# Patient Record
Sex: Male | Born: 1965 | Race: Black or African American | Hispanic: No | Marital: Married | Smoking: Former smoker
Health system: Southern US, Community
[De-identification: ages and names within clinical notes are randomized; demographics above are authoritative.]

## PROBLEM LIST (undated history)

## (undated) DIAGNOSIS — Z9109 Other allergy status, other than to drugs and biological substances: Secondary | ICD-10-CM

## (undated) DIAGNOSIS — F419 Anxiety disorder, unspecified: Secondary | ICD-10-CM

## (undated) DIAGNOSIS — K219 Gastro-esophageal reflux disease without esophagitis: Secondary | ICD-10-CM

## (undated) DIAGNOSIS — A048 Other specified bacterial intestinal infections: Secondary | ICD-10-CM

## (undated) DIAGNOSIS — K5790 Diverticulosis of intestine, part unspecified, without perforation or abscess without bleeding: Secondary | ICD-10-CM

## (undated) HISTORY — DX: Other specified bacterial intestinal infections: A04.8

## (undated) HISTORY — DX: Diverticulosis of intestine, part unspecified, without perforation or abscess without bleeding: K57.90

## (undated) HISTORY — DX: Gastro-esophageal reflux disease without esophagitis: K21.9

## (undated) HISTORY — DX: Anxiety disorder, unspecified: F41.9

## (undated) HISTORY — PX: KNEE ARTHROSCOPY W/ MENISCAL REPAIR: SHX1877

---

## 1997-12-12 ENCOUNTER — Emergency Department (HOSPITAL_COMMUNITY): Admission: EM | Admit: 1997-12-12 | Discharge: 1997-12-12 | Payer: Self-pay | Admitting: Emergency Medicine

## 1997-12-14 ENCOUNTER — Emergency Department (HOSPITAL_COMMUNITY): Admission: EM | Admit: 1997-12-14 | Discharge: 1997-12-14 | Payer: Self-pay | Admitting: Emergency Medicine

## 1997-12-27 ENCOUNTER — Ambulatory Visit (HOSPITAL_COMMUNITY): Admission: RE | Admit: 1997-12-27 | Discharge: 1997-12-27 | Payer: Self-pay | Admitting: Family Medicine

## 2002-02-11 ENCOUNTER — Emergency Department (HOSPITAL_COMMUNITY): Admission: EM | Admit: 2002-02-11 | Discharge: 2002-02-11 | Payer: Self-pay | Admitting: *Deleted

## 2002-02-11 ENCOUNTER — Encounter: Payer: Self-pay | Admitting: *Deleted

## 2002-06-25 ENCOUNTER — Encounter: Payer: Self-pay | Admitting: Emergency Medicine

## 2002-06-25 ENCOUNTER — Emergency Department (HOSPITAL_COMMUNITY): Admission: EM | Admit: 2002-06-25 | Discharge: 2002-06-26 | Payer: Self-pay | Admitting: Emergency Medicine

## 2002-06-26 ENCOUNTER — Emergency Department (HOSPITAL_COMMUNITY): Admission: EM | Admit: 2002-06-26 | Discharge: 2002-06-26 | Payer: Self-pay | Admitting: Emergency Medicine

## 2002-07-27 ENCOUNTER — Encounter: Payer: Self-pay | Admitting: Family Medicine

## 2002-07-27 ENCOUNTER — Encounter: Admission: RE | Admit: 2002-07-27 | Discharge: 2002-07-27 | Payer: Self-pay | Admitting: Family Medicine

## 2002-08-05 ENCOUNTER — Ambulatory Visit (HOSPITAL_COMMUNITY): Admission: RE | Admit: 2002-08-05 | Discharge: 2002-08-05 | Payer: Self-pay | Admitting: Otolaryngology

## 2008-06-03 ENCOUNTER — Encounter: Admission: RE | Admit: 2008-06-03 | Discharge: 2008-06-03 | Payer: Self-pay | Admitting: Family Medicine

## 2009-02-13 ENCOUNTER — Emergency Department (HOSPITAL_COMMUNITY): Admission: EM | Admit: 2009-02-13 | Discharge: 2009-02-13 | Payer: Self-pay | Admitting: Emergency Medicine

## 2009-03-22 ENCOUNTER — Emergency Department (HOSPITAL_COMMUNITY): Admission: EM | Admit: 2009-03-22 | Discharge: 2009-03-23 | Payer: Self-pay | Admitting: Emergency Medicine

## 2009-03-23 ENCOUNTER — Encounter: Admission: RE | Admit: 2009-03-23 | Discharge: 2009-03-23 | Payer: Self-pay | Admitting: Family Medicine

## 2009-03-23 ENCOUNTER — Ambulatory Visit (HOSPITAL_COMMUNITY): Admission: RE | Admit: 2009-03-23 | Discharge: 2009-03-23 | Payer: Self-pay | Admitting: Emergency Medicine

## 2009-04-01 ENCOUNTER — Emergency Department (HOSPITAL_COMMUNITY): Admission: EM | Admit: 2009-04-01 | Discharge: 2009-04-02 | Payer: Self-pay | Admitting: Emergency Medicine

## 2009-04-30 ENCOUNTER — Encounter: Admission: RE | Admit: 2009-04-30 | Discharge: 2009-04-30 | Payer: Self-pay | Admitting: Neurology

## 2009-05-15 ENCOUNTER — Emergency Department (HOSPITAL_COMMUNITY): Admission: EM | Admit: 2009-05-15 | Discharge: 2009-05-15 | Payer: Self-pay | Admitting: Emergency Medicine

## 2009-09-02 ENCOUNTER — Encounter: Admission: RE | Admit: 2009-09-02 | Discharge: 2009-09-02 | Payer: Self-pay | Admitting: Family Medicine

## 2010-04-09 ENCOUNTER — Encounter: Payer: Self-pay | Admitting: Neurology

## 2010-04-09 ENCOUNTER — Encounter: Payer: Self-pay | Admitting: Emergency Medicine

## 2010-06-04 LAB — COMPREHENSIVE METABOLIC PANEL
AST: 22 U/L (ref 0–37)
Albumin: 3.8 g/dL (ref 3.5–5.2)
Albumin: 4.6 g/dL (ref 3.5–5.2)
Alkaline Phosphatase: 45 U/L (ref 39–117)
BUN: 9 mg/dL (ref 6–23)
BUN: 12 mg/dL (ref 6–23)
Calcium: 9.1 mg/dL (ref 8.4–10.5)
Calcium: 9.3 mg/dL (ref 8.4–10.5)
Chloride: 105 mEq/L (ref 96–112)
Creatinine, Ser: 1.04 mg/dL (ref 0.4–1.5)
Creatinine, Ser: 1.14 mg/dL (ref 0.4–1.5)
GFR calc Af Amer: >60 mL/min (ref >60)
GFR calc Af Amer: >60 mL/min (ref >60)
Glucose, Bld: 135 mg/dL — ABNORMAL HIGH (ref 70–99)
Potassium: 3.7 mEq/L (ref 3.5–5.1)
Total Bilirubin: 0.7 mg/dL (ref 0.3–1.2)
Total Bilirubin: 0.6 mg/dL (ref 0.3–1.2)
Total Protein: 6.5 g/dL (ref 6.0–8.3)
Total Protein: 7.5 g/dL (ref 6.0–8.3)

## 2010-06-04 LAB — CBC
HCT: 41.3 % (ref 39.0–52.0)
HCT: 44.2 % (ref 39.0–52.0)
Hemoglobin: 14.9 g/dL (ref 13.0–17.0)
MCHC: 34.5 g/dL (ref 30.0–36.0)
MCV: 95.8 fL (ref 78.0–100.0)
Platelets: 252 10*3/uL (ref 150–400)
RBC: 4.37 MIL/uL (ref 4.22–5.81)
WBC: 5.1 10*3/uL (ref 4.0–10.5)

## 2010-06-04 LAB — POCT CARDIAC MARKERS
CKMB, poc: 1.4 ng/mL (ref 1.0–8.0)
Troponin i, poc: <0.05 ng/mL (ref 0.00–0.09)

## 2010-06-04 LAB — CK: Total CK: 565 U/L — ABNORMAL HIGH (ref 7–232)

## 2010-06-04 LAB — DIFFERENTIAL
Basophils Relative: 0 % (ref 0–1)
Eosinophils Absolute: 0.1 10*3/uL (ref 0.0–0.7)
Eosinophils Absolute: 0.1 10*3/uL (ref 0.0–0.7)
Eosinophils Relative: 2 % (ref 0–5)
Lymphocytes Relative: 32 % (ref 12–46)
Lymphs Abs: 1.6 10*3/uL (ref 0.7–4.0)
Monocytes Absolute: 0.5 10*3/uL (ref 0.1–1.0)
Monocytes Relative: 6 % (ref 3–12)
Neutro Abs: 3 10*3/uL (ref 1.7–7.7)
Neutrophils Relative %: 49 % (ref 43–77)

## 2010-06-04 LAB — LIPASE, BLOOD: Lipase: 34 U/L (ref 11–59)

## 2010-06-07 LAB — DIFFERENTIAL
Lymphocytes Relative: 20 % (ref 12–46)
Neutro Abs: 3.2 10*3/uL (ref 1.7–7.7)
Neutrophils Relative %: 72 % (ref 43–77)

## 2010-06-07 LAB — CBC
HCT: 42.8 % (ref 39.0–52.0)
Hemoglobin: 14.4 g/dL (ref 13.0–17.0)
MCHC: 33.7 g/dL (ref 30.0–36.0)
MCV: 95.7 fL (ref 78.0–100.0)
Platelets: 217 10*3/uL (ref 150–400)
RDW: 12.5 % (ref 11.5–15.5)
WBC: 4.4 10*3/uL (ref 4.0–10.5)

## 2010-06-07 LAB — BASIC METABOLIC PANEL
BUN: 10 mg/dL (ref 6–23)
Creatinine, Ser: 0.98 mg/dL (ref 0.4–1.5)
GFR calc Af Amer: >60 mL/min (ref >60)
Sodium: 138 mEq/L (ref 135–145)

## 2010-06-21 LAB — BASIC METABOLIC PANEL
BUN: 9 mg/dL (ref 6–23)
Chloride: 100 mEq/L (ref 96–112)
GFR calc Af Amer: >60 mL/min (ref >60)
GFR calc non Af Amer: >60 mL/min (ref >60)
Glucose, Bld: 112 mg/dL — ABNORMAL HIGH (ref 70–99)
Potassium: 3.3 mEq/L — ABNORMAL LOW (ref 3.5–5.1)
Sodium: 133 mEq/L — ABNORMAL LOW (ref 135–145)

## 2010-06-21 LAB — CBC
HCT: 44.2 % (ref 39.0–52.0)
MCHC: 34.8 g/dL (ref 30.0–36.0)
MCV: 95.3 fL (ref 78.0–100.0)
Platelets: 228 10*3/uL (ref 150–400)
RBC: 4.64 MIL/uL (ref 4.22–5.81)

## 2010-06-21 LAB — DIFFERENTIAL
Eosinophils Absolute: 0.1 10*3/uL (ref 0.0–0.7)
Lymphocytes Relative: 40 % (ref 12–46)
Lymphs Abs: 2.1 10*3/uL (ref 0.7–4.0)
Monocytes Absolute: 0.6 10*3/uL (ref 0.1–1.0)
Monocytes Relative: 12 % (ref 3–12)
Neutro Abs: 2.5 10*3/uL (ref 1.7–7.7)

## 2010-06-21 LAB — POCT CARDIAC MARKERS: Myoglobin, poc: 106 ng/mL (ref 12–200)

## 2010-10-30 IMAGING — CT CT HEAD W/O CM
1 series · 16 of 30 positions shown, 20 images · non-contrast
Comparison: None available

CLINICAL DATA: Left facial numbness, chest pressure

CT HEAD WITHOUT CONTRAST
TECHNIQUE: Contiguous axial images were obtained from the base of
the skull through the vertex without contrast.

[Series 2: headseq 4.8 h37s · axial · 0.43mm/px · z∈[+1195,+1355]mm · 16 of 36 slices shown, 20 images]
[im 2/36  brain]
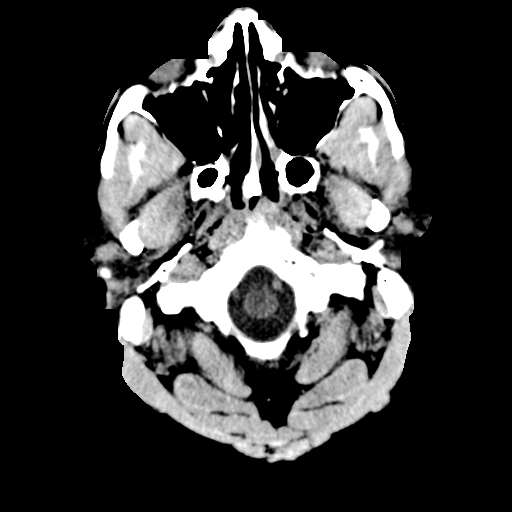
[im 2/36  bone]
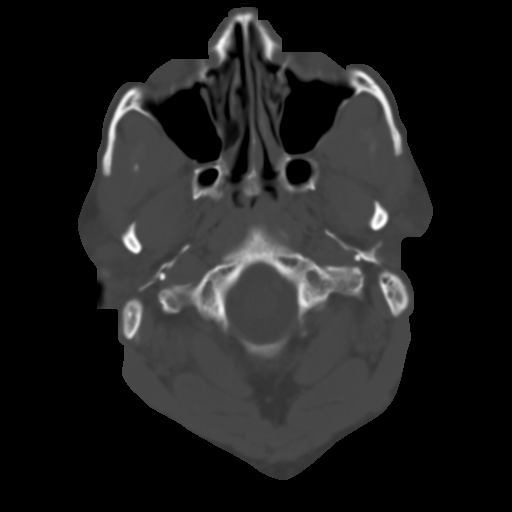
[im 4/36  brain]
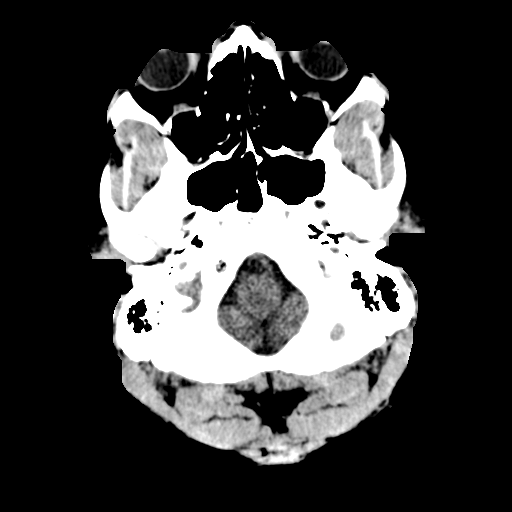
[im 7/36  brain]
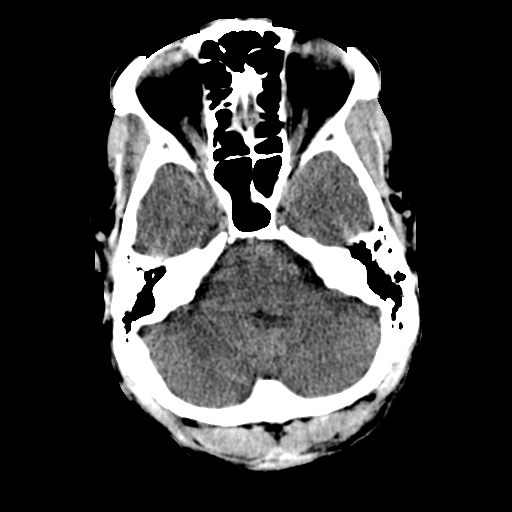
[im 9/36  brain]
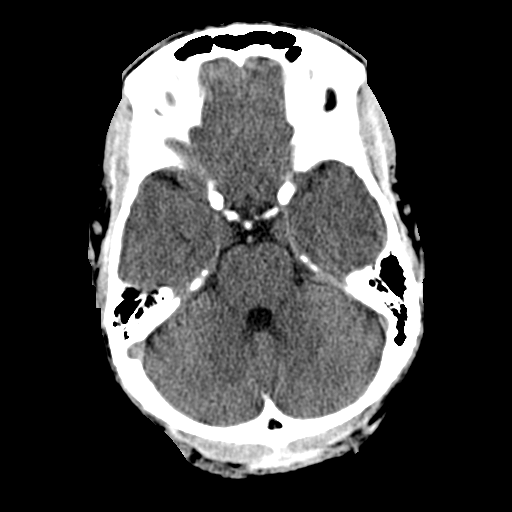
[im 10/36  brain]
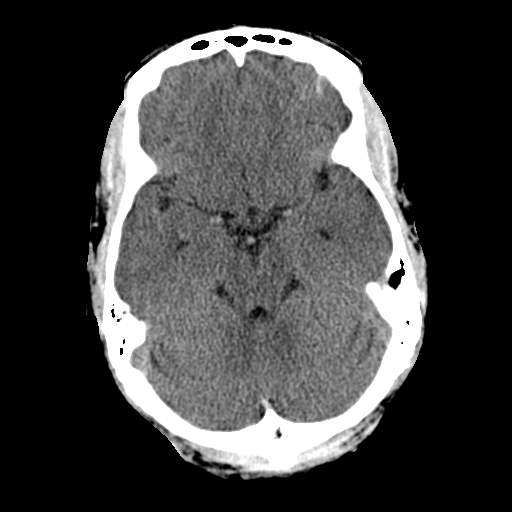
[im 10/36  bone]
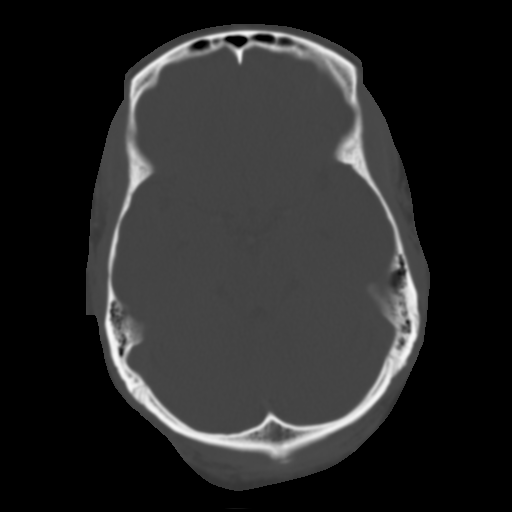
[im 13/36  brain]
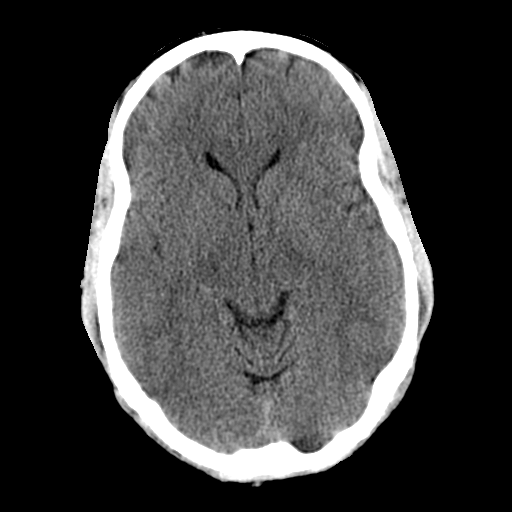
[im 15/36  brain]
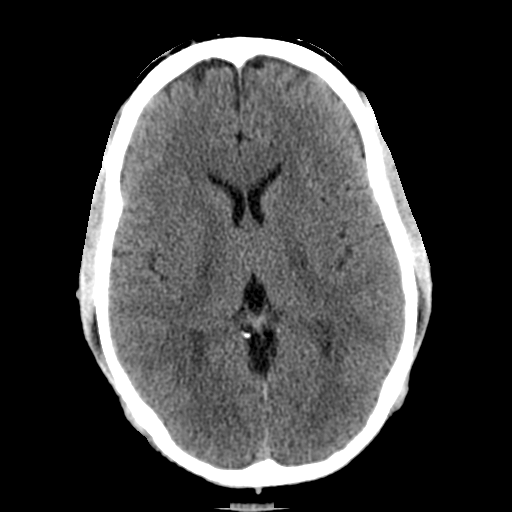
[im 17/36  brain]
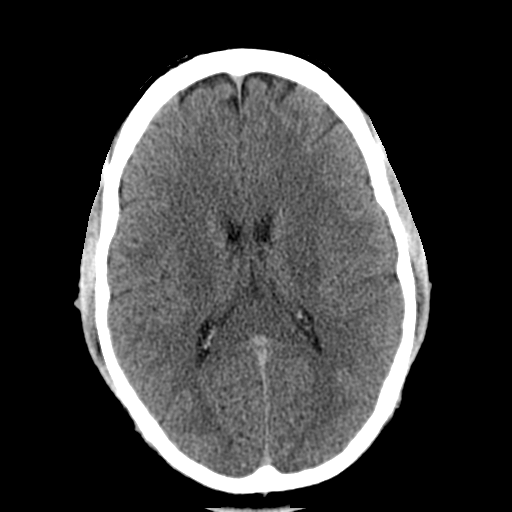
[im 19/36  brain]
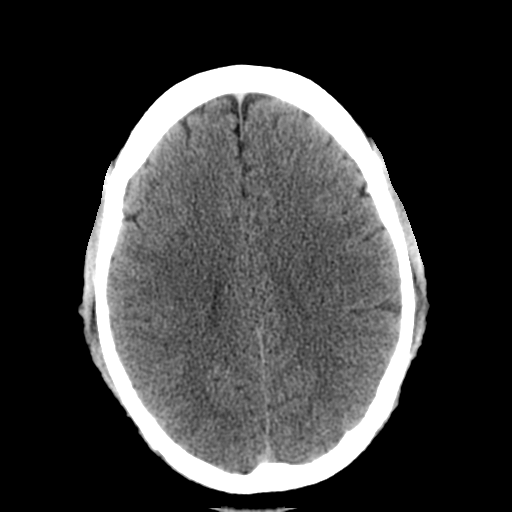
[im 19/36  bone]
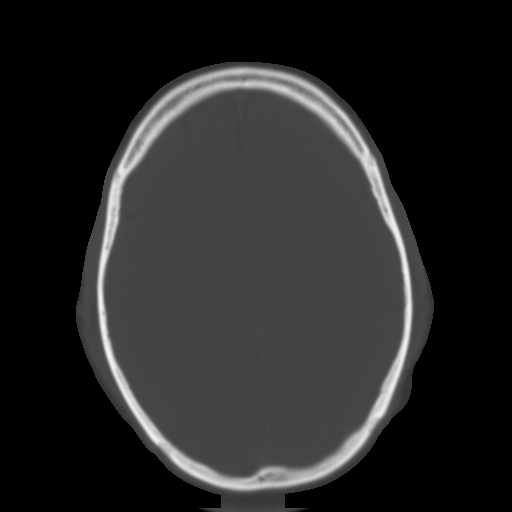
[im 21/36  brain]
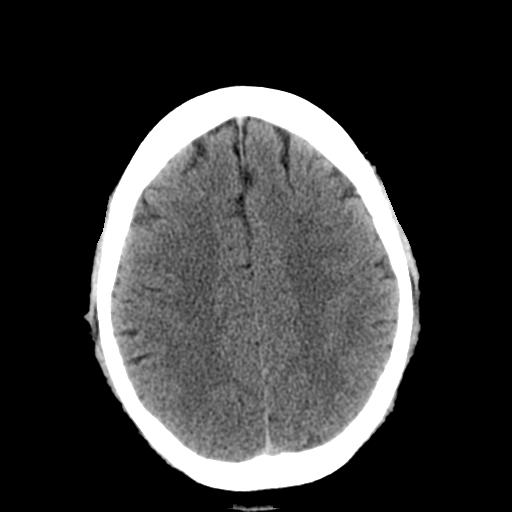
[im 23/36  brain]
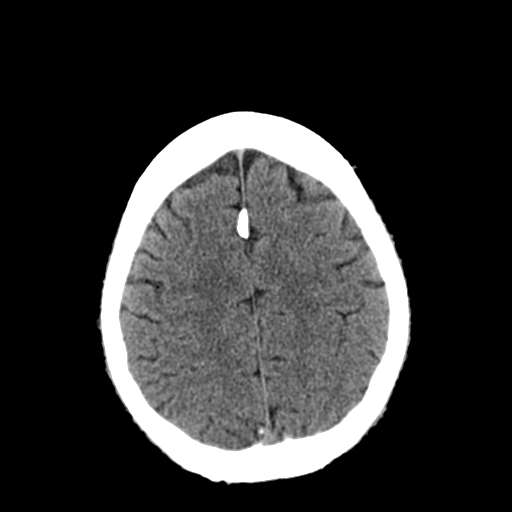
[im 26/36  brain]
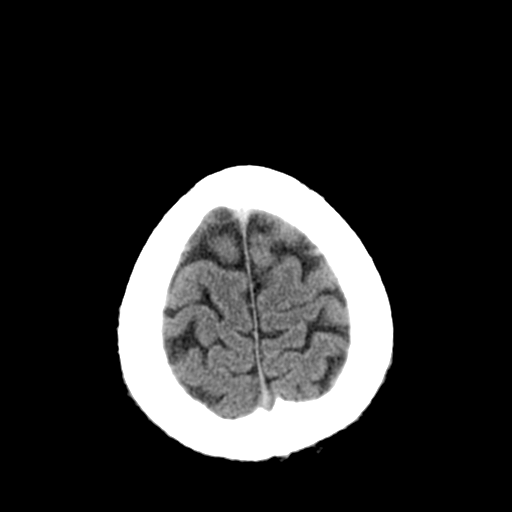
[im 27/36  brain]
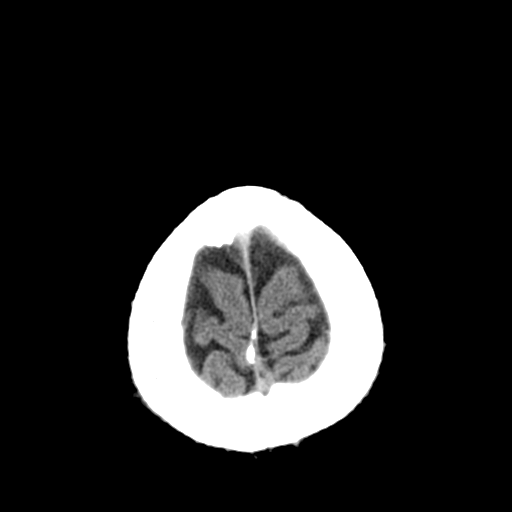
[im 27/36  bone]
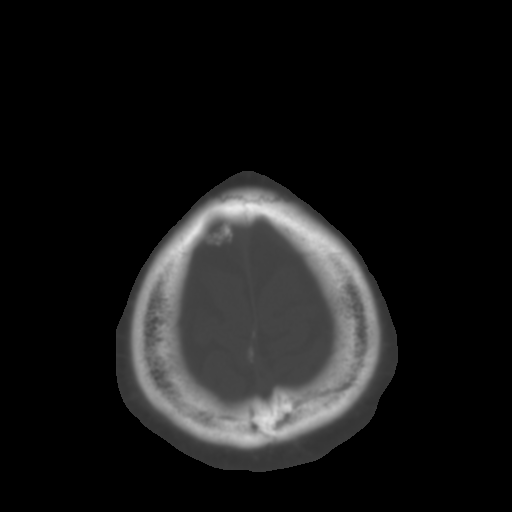
[im 29/36  brain]
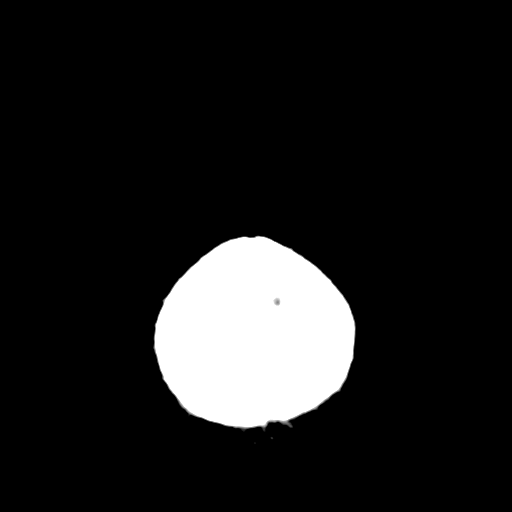
[im 32/36  brain]
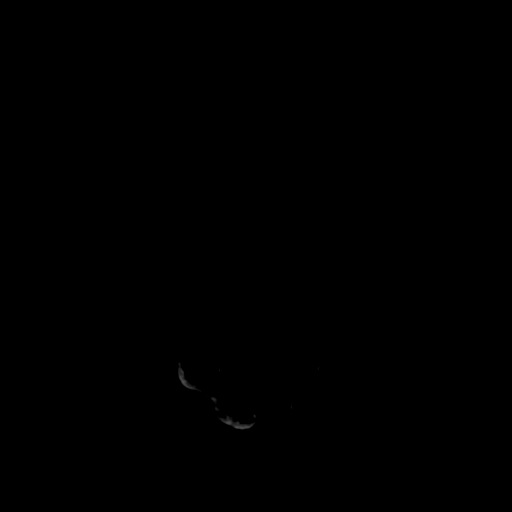
[im 34/36  brain]
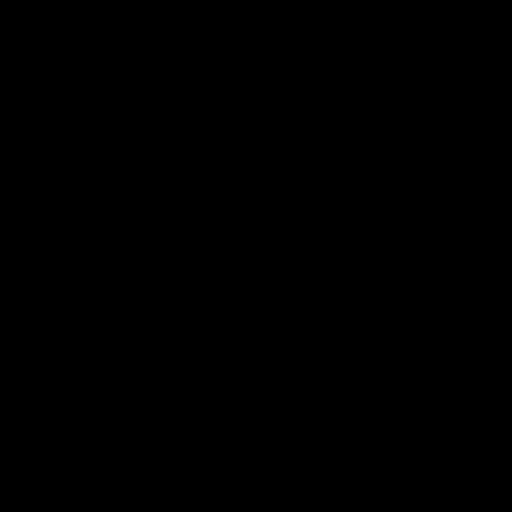

[16 of 30 positions shown; findings below may reference images not displayed]

FINDINGS: Normal ventricular morphology.
No midline shift or mass effect.
Normal appearance of brain parenchyma.
No intracranial hemorrhage, mass lesion, or acute infarction.
Visualized paranasal sinuses and mastoid air cells clear.
Bones unremarkable.
IMPRESSION: No acute intracranial abnormalities.

## 2010-10-30 IMAGING — CR DG CHEST 2V
2 series · 2 of 2 positions shown · non-contrast
Comparison: None

CLINICAL DATA: Left-sided chest and shoulder pressure; facial
numbness.

CHEST - 2 VIEW

[view not recorded (1 of 2)]
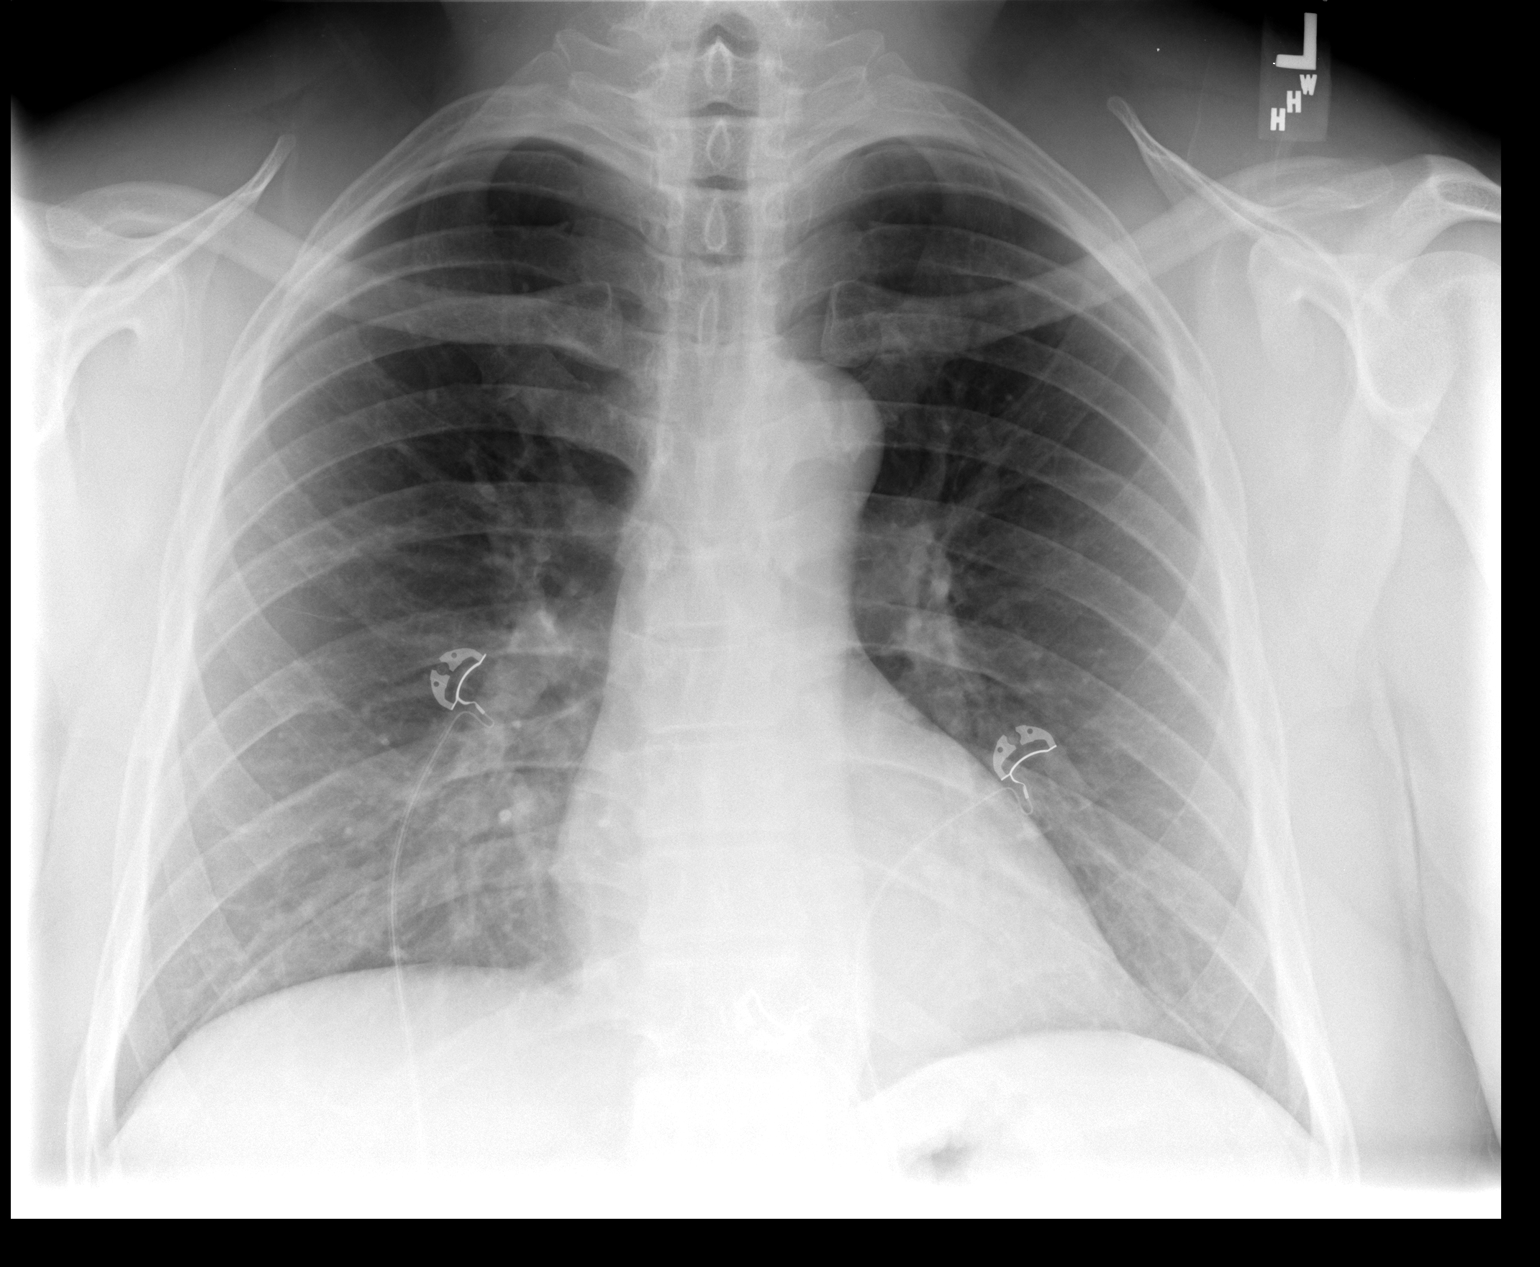

[view not recorded (2 of 2)]
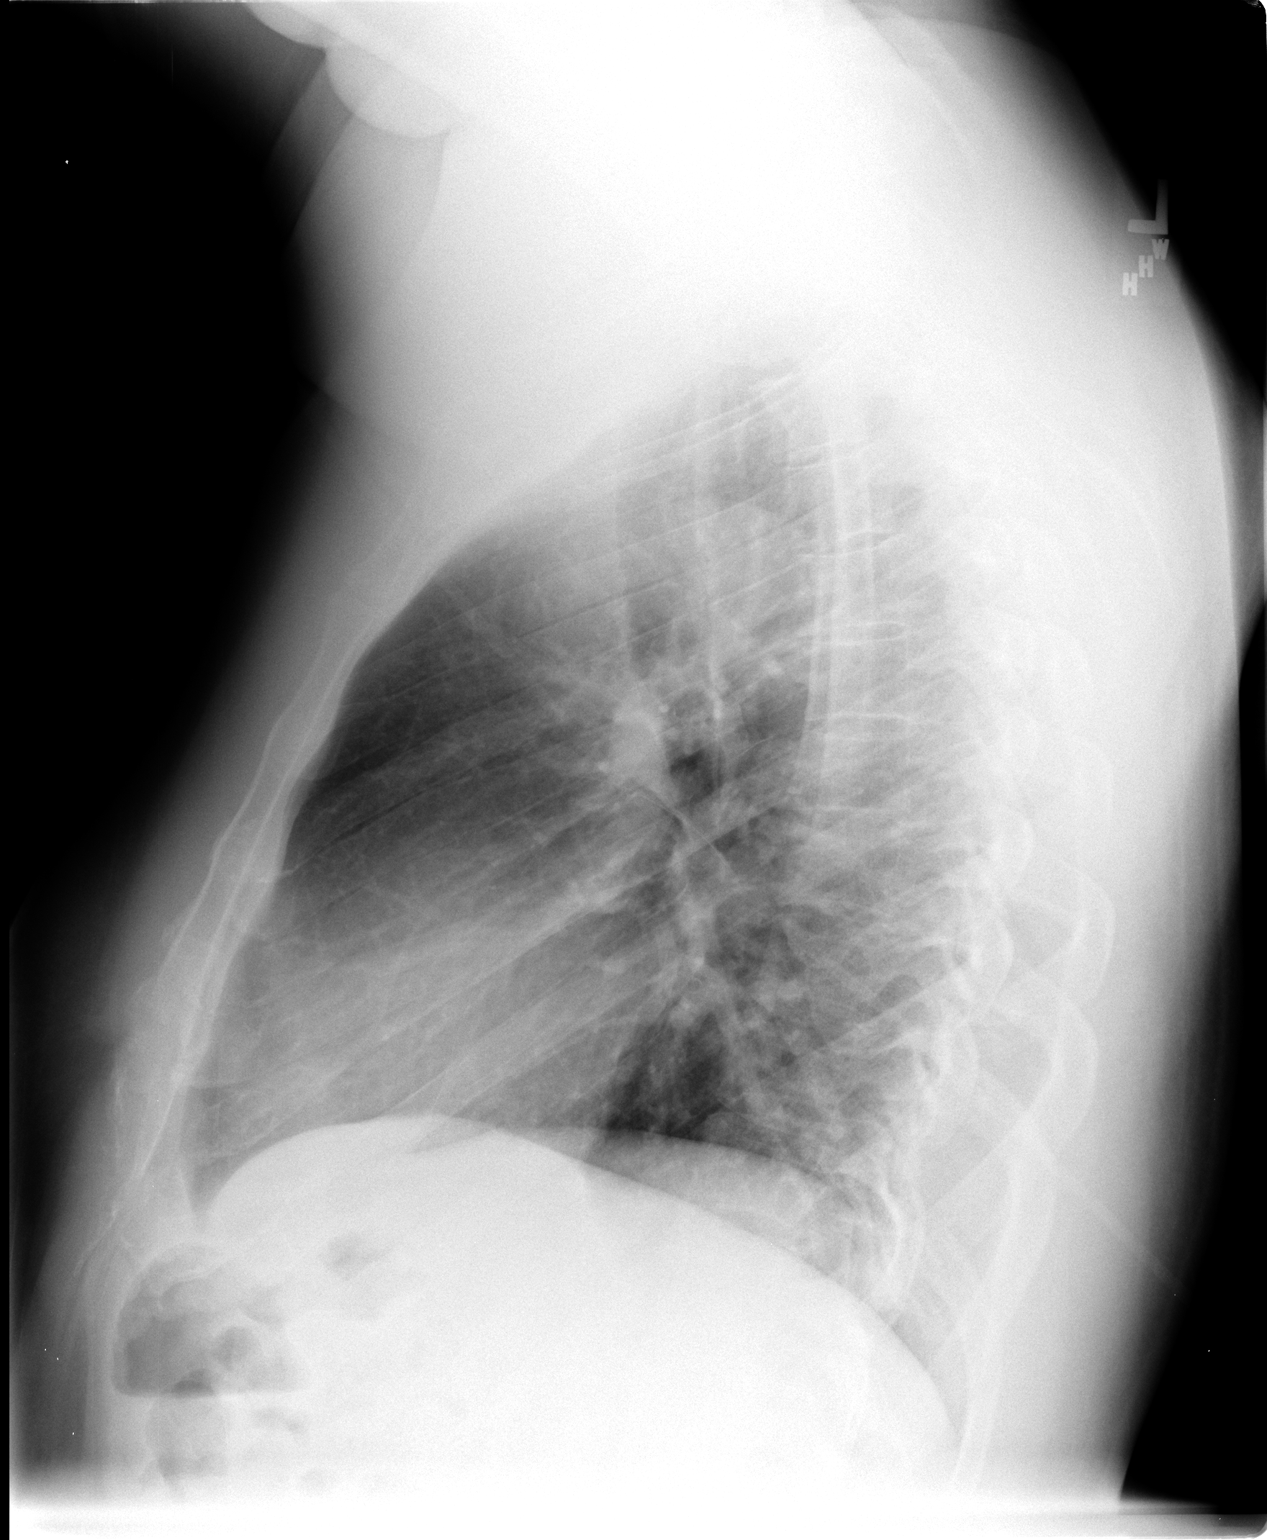

[2 of 2 positions shown; findings below may reference images not displayed]

FINDINGS: The lungs are well-aerated and clear.  There is no
evidence of focal opacification, pleural effusion or pneumothorax.

The heart is normal in size; the mediastinal contour is within
normal limits.  No acute osseous abnormalities are seen.
IMPRESSION: No acute cardiopulmonary process seen.

## 2011-09-17 ENCOUNTER — Emergency Department (HOSPITAL_COMMUNITY): Payer: BC Managed Care – PPO

## 2011-09-17 ENCOUNTER — Emergency Department (HOSPITAL_COMMUNITY)
Admission: EM | Admit: 2011-09-17 | Discharge: 2011-09-17 | Disposition: A | Discharge status: Home / Self Care | Payer: BC Managed Care – PPO | Attending: Emergency Medicine | Admitting: Emergency Medicine

## 2011-09-17 ENCOUNTER — Encounter (HOSPITAL_COMMUNITY): Payer: Self-pay | Admitting: Emergency Medicine

## 2011-09-17 DIAGNOSIS — R61 Generalized hyperhidrosis: Secondary | ICD-10-CM | POA: Insufficient documentation

## 2011-09-17 DIAGNOSIS — R0789 Other chest pain: Secondary | ICD-10-CM | POA: Insufficient documentation

## 2011-09-17 LAB — CBC WITH DIFFERENTIAL/PLATELET
Basophils Relative: 0 % (ref 0–1)
Eosinophils Absolute: 0.1 10*3/uL (ref 0.0–0.7)
Eosinophils Relative: 2 % (ref 0–5)
HCT: 41.2 % (ref 39.0–52.0)
Hemoglobin: 14.5 g/dL (ref 13.0–17.0)
Lymphs Abs: 1.9 10*3/uL (ref 0.7–4.0)
MCH: 31.9 pg (ref 26.0–34.0)
MCHC: 35.2 g/dL (ref 30.0–36.0)
MCV: 90.5 fL (ref 78.0–100.0)
Monocytes Absolute: 0.4 10*3/uL (ref 0.1–1.0)
Monocytes Relative: 9 % (ref 3–12)
Neutrophils Relative %: 45 % (ref 43–77)
RBC: 4.55 MIL/uL (ref 4.22–5.81)

## 2011-09-17 LAB — POCT I-STAT TROPONIN I: Troponin i, poc: 0 ng/mL (ref 0.00–0.08)

## 2011-09-17 LAB — COMPREHENSIVE METABOLIC PANEL
Alkaline Phosphatase: 51 U/L (ref 39–117)
BUN: 12 mg/dL (ref 6–23)
Creatinine, Ser: 1.13 mg/dL (ref 0.50–1.35)
GFR calc Af Amer: 89 mL/min — ABNORMAL LOW (ref >90)
Glucose, Bld: 123 mg/dL — ABNORMAL HIGH (ref 70–99)
Potassium: 3.6 mEq/L (ref 3.5–5.1)
Total Bilirubin: 0.4 mg/dL (ref 0.3–1.2)
Total Protein: 6.8 g/dL (ref 6.0–8.3)

## 2011-09-17 MED ORDER — NITROGLYCERIN 2 % TD OINT
1.0000 [in_us] | TOPICAL_OINTMENT | Freq: Once | TRANSDERMAL | Status: AC
  Administered 2011-09-17: 1 [in_us] via TOPICAL
  Filled 2011-09-17: qty 1

## 2011-09-17 NOTE — Discharge Instructions (Signed)
Chest Pain (Nonspecific) It is often hard to give a specific diagnosis for the cause of chest pain. There is always a chance that your pain could be related to something serious, such as a heart attack or a blood clot in the lungs. You need to follow up with your caregiver for further evaluation. CAUSES   Heartburn.   Pneumonia or bronchitis.   Anxiety or stress.   Inflammation around your heart (pericarditis) or lung (pleuritis or pleurisy).   A blood clot in the lung.   A collapsed lung (pneumothorax). It can develop suddenly on its own (spontaneous pneumothorax) or from injury (trauma) to the chest.   Shingles infection (herpes zoster virus).  The chest wall is composed of bones, muscles, and cartilage. Any of these can be the source of the pain.  The bones can be bruised by injury.   The muscles or cartilage can be strained by coughing or overwork.   The cartilage can be affected by inflammation and become sore (costochondritis).  DIAGNOSIS  Lab tests or other studies, such as X-rays, electrocardiography, stress testing, or cardiac imaging, may be needed to find the cause of your pain.  TREATMENT   Treatment depends on what may be causing your chest pain. Treatment may include:   Acid blockers for heartburn.   Anti-inflammatory medicine.   Pain medicine for inflammatory conditions.   Antibiotics if an infection is present.   You may be advised to change lifestyle habits. This includes stopping smoking and avoiding alcohol, caffeine, and chocolate.   You may be advised to keep your head raised (elevated) when sleeping. This reduces the chance of acid going backward from your stomach into your esophagus.   Most of the time, nonspecific chest pain will improve within 2 to 3 days with rest and mild pain medicine.  HOME CARE INSTRUCTIONS   If antibiotics were prescribed, take your antibiotics as directed. Finish them even if you start to feel better.   For the next few  days, avoid physical activities that bring on chest pain. Continue physical activities as directed.   Do not smoke.   Avoid drinking alcohol.   Only take over-the-counter or prescription medicine for pain, discomfort, or fever as directed by your caregiver.   Follow your caregiver's suggestions for further testing if your chest pain does not go away.   Keep any follow-up appointments you made. If you do not go to an appointment, you could develop lasting (chronic) problems with pain. If there is any problem keeping an appointment, you must call to reschedule.  SEEK MEDICAL CARE IF:   You think you are having problems from the medicine you are taking. Read your medicine instructions carefully.   Your chest pain does not go away, even after treatment.   You develop a rash with blisters on your chest.  SEEK IMMEDIATE MEDICAL CARE IF:   You have increased chest pain or pain that spreads to your arm, neck, jaw, back, or abdomen.   You develop shortness of breath, an increasing cough, or you are coughing up blood.   You have severe back or abdominal pain, feel nauseous, or vomit.   You develop severe weakness, fainting, or chills.   You have a fever.  THIS IS AN EMERGENCY. Do not wait to see if the pain will go away. Get medical help at once. Call your local emergency services (911 in [place] Do not drive yourself to the hospital. MAKE SURE YOU:   Understand these instructions.     Will watch your condition.   Will get help right away if you are not doing well or get worse.  Document Released: 12/13/2004 Document Revised: 02/22/2011 Document Reviewed: 10/09/2007 [employer] Patient Information 2012 [employer]. 

## 2011-09-17 NOTE — ED Provider Notes (Signed)
Medical screening examination/treatment/procedure(s) were conducted as a shared visit with non-physician practitioner(s) and myself.  I personally evaluated the patient during the encounter  Pt seen with PA [facility] He was well appearing on my exam, no distress.  Troponin x2 negative and given history/exam/risk factors unlikely ACS.  Doubt PE and PERC negative BP 106/77  Pulse 55  Temp 98.3 F (36.8 C) (Oral)  Resp 12  SpO2 100%   Joya Gaskins, MD 09/17/11 [phone]

## 2011-09-17 NOTE — ED Notes (Signed)
Pt went to bed feeling gassy. Woke up with intermittent right sided CP, and diaphoresis. Denies n/v. Pain currently resolved. Pain relived by releasing gas.

## 2011-09-17 NOTE — ED Provider Notes (Signed)
History     CSN: [id number]  Arrival date & time 09/17/11  0203   First MD Initiated Contact with Patient 09/17/11 0204      Chief Complaint  Patient presents with  . Chest Pain    (Consider location/radiation/quality/duration/timing/severity/associated sxs/prior treatment) HPI Comments: Patient presents after having been awakened from sleep with right sided anterior chest pain - he states that he immediately felt diaphoretic but denies shortness of breath or nausea with this - states that he called 911 once his pain did not go away like normal - he states he has a history of GERD for which he takes prilosec and the occasional tums but this pain did not feel the same - he states his PCP is Dr. Renaye Rakers and has no other health problems.  Patient is a 46 y.o. male presenting with chest pain. The history is provided by the patient. No language interpreter was used.  Chest Pain The chest pain began 1 - 2 hours ago. Duration of episode(s) is 1 hour. Chest pain occurs constantly. The chest pain is resolved. At its most intense, the pain is at 7/10. The pain is currently at 0/10. The severity of the pain is moderate. The quality of the pain is described as dull. The pain does not radiate. Pertinent negatives for primary symptoms include no fever, no fatigue, no syncope, no shortness of breath, no cough, no wheezing, no palpitations, no abdominal pain, no nausea, no vomiting, no dizziness and no altered mental status.  Associated symptoms include diaphoresis.  Pertinent negatives for associated symptoms include no claudication, no lower extremity edema, no near-syncope, no numbness, no orthopnea, no paroxysmal nocturnal dyspnea and no weakness. He tried aspirin and nitroglycerin for the symptoms. Risk factors include male gender.  Pertinent negatives for past medical history include no CAD, no DVT, no MI, no PVD, no strokes and no TIA.     History reviewed. No pertinent past medical  history.  History reviewed. No pertinent past surgical history.  No family history on file.  History  Substance Use Topics  . Smoking status: Not on file  . Smokeless tobacco: Not on file  . Alcohol Use: Not on file      Review of Systems  Constitutional: Positive for diaphoresis. Negative for fever and fatigue.  HENT: Negative for neck pain.   Eyes: Negative for pain.  Respiratory: Negative for cough, shortness of breath and wheezing.   Cardiovascular: Positive for chest pain. Negative for palpitations, orthopnea, claudication, syncope and near-syncope.  Gastrointestinal: Negative for nausea, vomiting and abdominal pain.  Genitourinary: Negative for dysuria.  Musculoskeletal: Negative for back pain.  Neurological: Negative for dizziness, weakness and numbness.  Psychiatric/Behavioral: Negative for confusion and altered mental status.  All other systems reviewed and are negative.    Allergies  Review of patient's allergies indicates no known allergies.  Home Medications  No current outpatient prescriptions on file.  There were no vitals taken for this visit.  Physical Exam  Nursing note and vitals reviewed. Constitutional: He is oriented to person, place, and time. He appears well-developed and well-nourished. No distress.  HENT:  Head: Normocephalic and atraumatic.  Right Ear: External ear normal.  Left Ear: External ear normal.  Nose: Nose normal.  Mouth/Throat: Oropharynx is clear and moist. No oropharyngeal exudate.  Eyes: Conjunctivae are normal. Pupils are equal, round, and reactive to light. No scleral icterus.  Neck: Normal range of motion. Neck supple. No JVD present.  Cardiovascular: Normal rate, regular rhythm and  normal heart sounds.  Exam reveals no gallop and no friction rub.   No murmur heard. Pulmonary/Chest: Effort normal and breath sounds normal. No respiratory distress. He has no wheezes. He has no rales. He exhibits no tenderness.  Abdominal:  Soft. Bowel sounds are normal. He exhibits no distension. There is no tenderness.  Musculoskeletal: Normal range of motion. He exhibits no edema and no tenderness.  Lymphadenopathy:    He has no cervical adenopathy.  Neurological: He is alert and oriented to person, place, and time. No cranial nerve deficit. He exhibits normal muscle tone. Coordination normal.  Skin: Skin is warm and dry. No rash noted. No erythema. No pallor.  Psychiatric: He has a normal mood and affect. His behavior is normal. Judgment and thought content normal.    ED Course  Procedures (including critical care time)   Labs Reviewed  CBC WITH DIFFERENTIAL  COMPREHENSIVE METABOLIC PANEL   No results found.  Results for orders placed during the hospital encounter of 09/17/11  CBC WITH DIFFERENTIAL      Component Value Range   WBC 4.2  4.0 - 10.5 K/uL   RBC 4.55  4.22 - 5.81 MIL/uL   Hemoglobin 14.5  13.0 - 17.0 g/dL   HCT 16.1  09.6 - 04.5 %   MCV 90.5  78.0 - 100.0 fL   MCH 31.9  26.0 - 34.0 pg   MCHC 35.2  30.0 - 36.0 g/dL   RDW 40.9  81.1 - 91.4 %   Platelets 195  150 - 400 K/uL   Neutrophils Relative 45  43 - 77 %   Neutro Abs 1.9  1.7 - 7.7 K/uL   Lymphocytes Relative 44  12 - 46 %   Lymphs Abs 1.9  0.7 - 4.0 K/uL   Monocytes Relative 9  3 - 12 %   Monocytes Absolute 0.4  0.1 - 1.0 K/uL   Eosinophils Relative 2  0 - 5 %   Eosinophils Absolute 0.1  0.0 - 0.7 K/uL   Basophils Relative 0  0 - 1 %   Basophils Absolute 0.0  0.0 - 0.1 K/uL  COMPREHENSIVE METABOLIC PANEL      Component Value Range   Sodium 138  135 - 145 mEq/L   Potassium 3.6  3.5 - 5.1 mEq/L   Chloride 102  96 - 112 mEq/L   CO2 26  19 - 32 mEq/L   Glucose, Bld 123 (*) 70 - 99 mg/dL   BUN 12  6 - 23 mg/dL   Creatinine, Ser 7.82  0.50 - 1.35 mg/dL   Calcium 9.4  8.4 - 95.6 mg/dL   Total Protein 6.8  6.0 - 8.3 g/dL   Albumin 3.8  3.5 - 5.2 g/dL   AST 24  0 - 37 U/L   ALT 31  0 - 53 U/L   Alkaline Phosphatase 51  39 - 117 U/L    Total Bilirubin 0.4  0.3 - 1.2 mg/dL   GFR calc non Af Amer 77 (*) >90 mL/min   GFR calc Af Amer 89 (*) >90 mL/min  POCT I-STAT TROPONIN I      Component Value Range   Troponin i, poc 0.00  0.00 - 0.08 ng/mL   Comment 3           POCT I-STAT TROPONIN I      Component Value Range   Troponin i, poc 0.00  0.00 - 0.08 ng/mL   Comment 3  Dg Chest 2 View  09/17/2011  *RADIOLOGY REPORT*  Clinical Data: Chest pain  CHEST - 2 VIEW  Comparison: 05/15/2009  Findings: Mild aortic tortuosity.  Cardiomediastinal contours within normal range.  Mildly prominent markings at the lung bases is similar to priors and likely accentuated by overlying tissues rather than true consolidation.  No pleural effusion or pneumothorax.  No acute osseous finding.  IMPRESSION: No radiographic evidence of acute cardiopulmonary process.  Original Report Authenticated By: Waneta Martins, M.D.     Date: 09/17/2011  Rate: 54  Rhythm: sinus bradycardia  QRS Axis: normal  Intervals: normal  ST/T Wave abnormalities: normal  Conduction Disutrbances:none  Narrative Interpretation: Reviewed by Dr. Bebe Shaggy  Old EKG Reviewed: unchanged   Atypical Chest pain    MDM  Patient here with right sided anterior chest pain that lasted about 1 hour - there does not appear to be any signs of ACS, patient is PERC negative and chest x-ray is also negative - has a history of GERD and he believes this to be related - he will follow up with his PCP, Dr. Loleta Chance for further evaluation of this.          Izola Price [place], [place] 09/17/11 [phone]

## 2011-09-17 NOTE — ED Notes (Signed)
PER EMS- CP starting this evening, pain woke pt up from sleep. Pain starts in the middle radiates to the right side of his chest. Describes pain as dull. Rated pain 7 out of 10. Pain relieved by 1 NTG currently rating pain 1 out of 10. Received 324 asa. 18 L AC. No cardiac history. No meds, No allergies.

## 2013-06-02 IMAGING — CR DG CHEST 2V
2 series · 2 of 2 positions shown · non-contrast
Comparison: 05/15/2009

CLINICAL DATA: Chest pain

CHEST - 2 VIEW

[w chest pa]
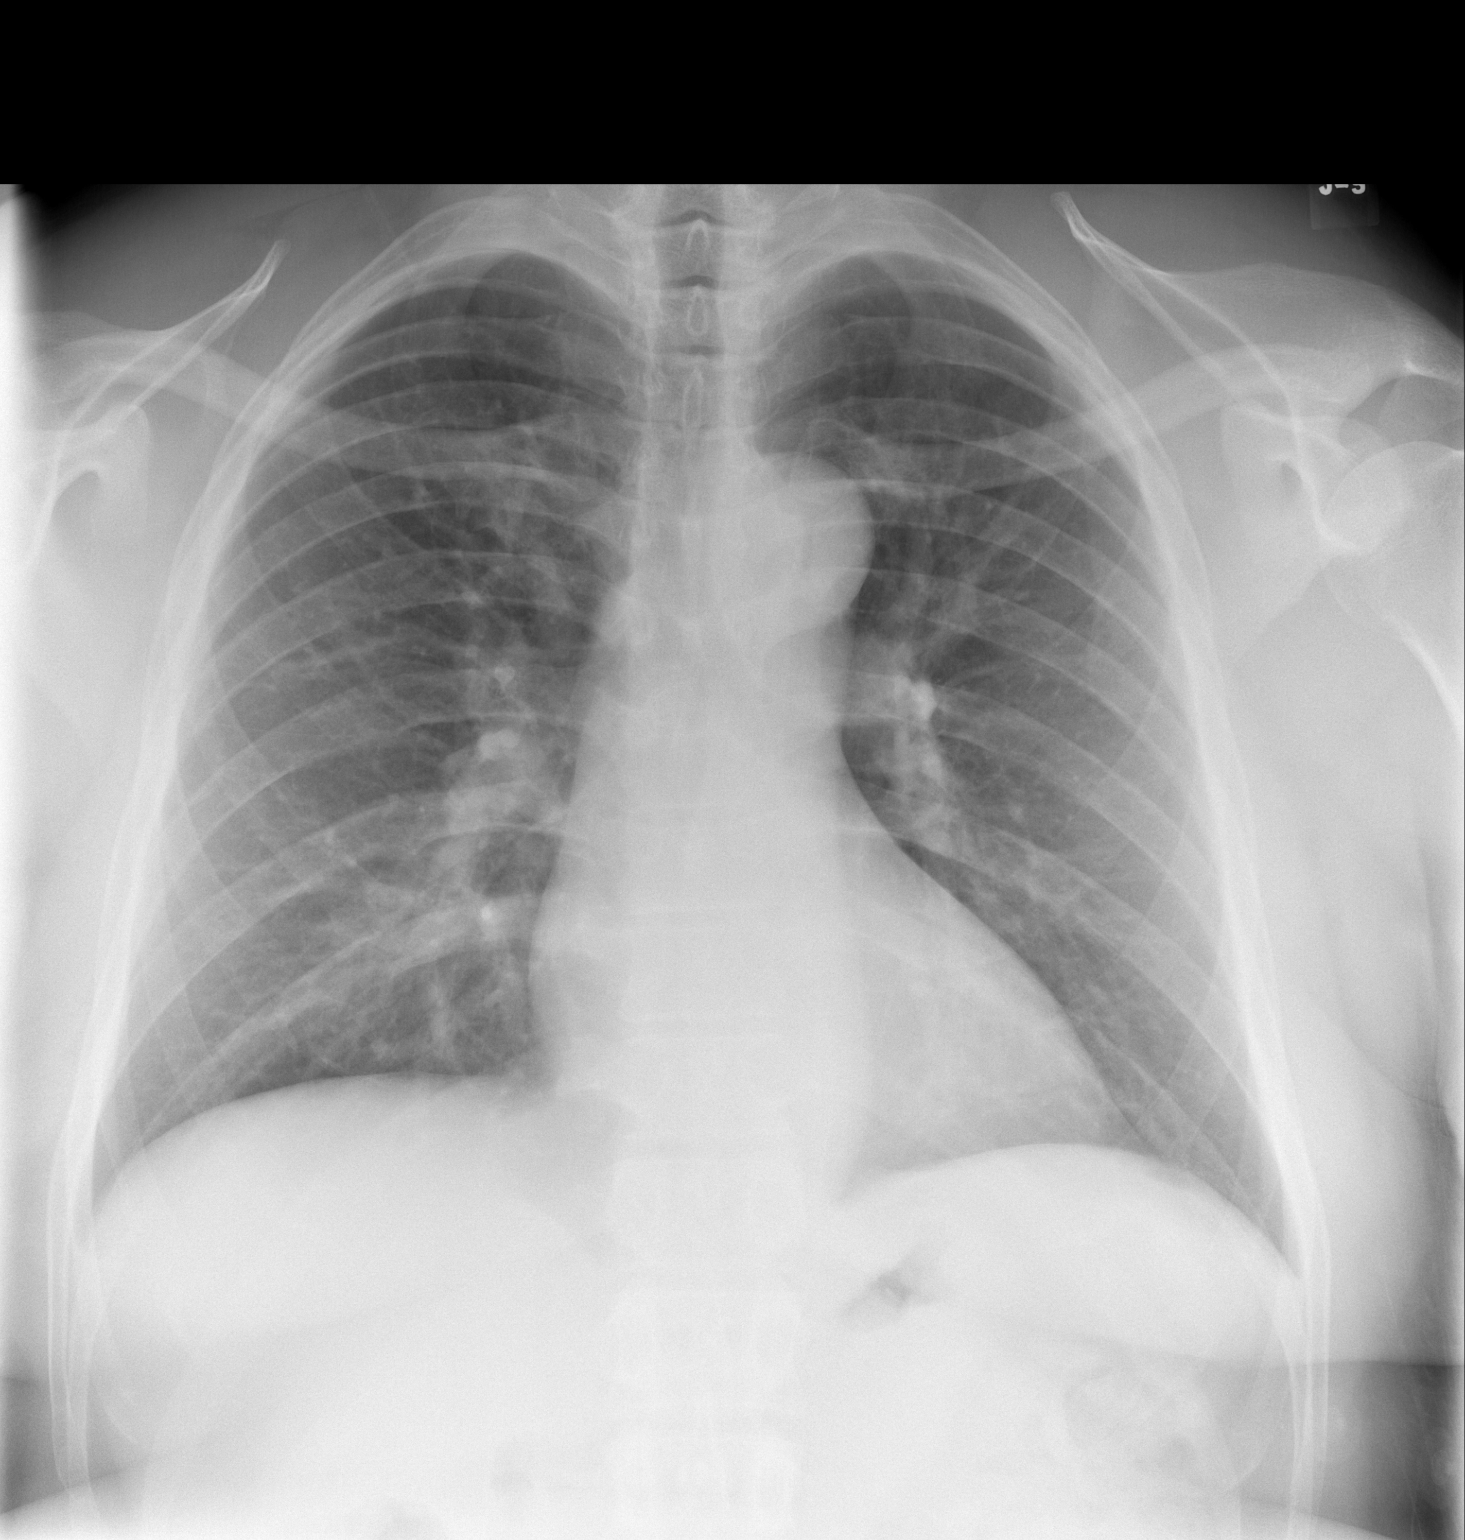

[w chest lat]
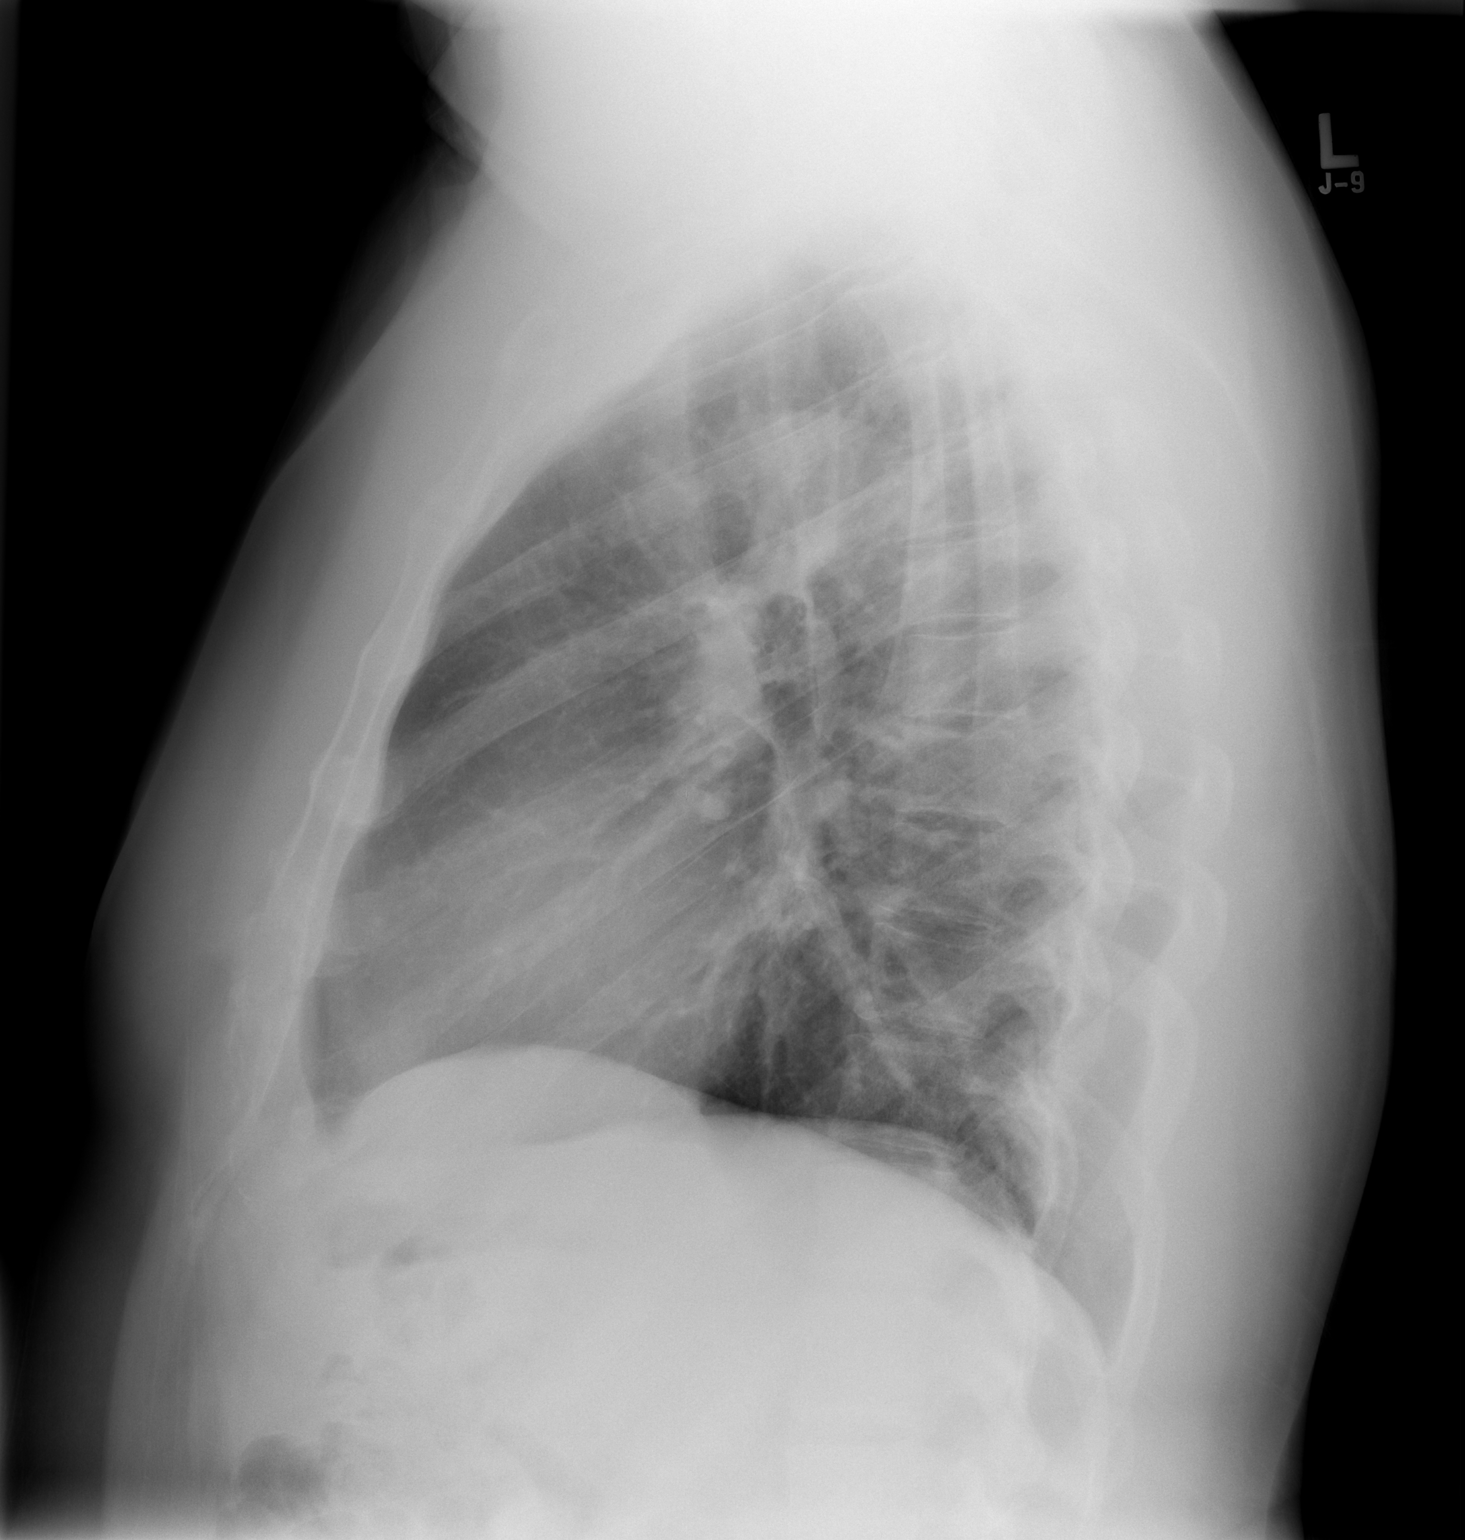

[2 of 2 positions shown; findings below may reference images not displayed]

FINDINGS: Mild aortic tortuosity.  Cardiomediastinal contours
within normal range.  Mildly prominent markings at the lung bases
is similar to priors and likely accentuated by overlying tissues
rather than true consolidation.  No pleural effusion or
pneumothorax.  No acute osseous finding.
IMPRESSION: No radiographic evidence of acute cardiopulmonary process.

## 2017-07-11 ENCOUNTER — Encounter (HOSPITAL_COMMUNITY): Payer: Self-pay

## 2017-07-11 ENCOUNTER — Emergency Department (HOSPITAL_COMMUNITY): Payer: BC Managed Care – PPO

## 2017-07-11 ENCOUNTER — Emergency Department (HOSPITAL_COMMUNITY)
Admission: EM | Admit: 2017-07-11 | Discharge: 2017-07-11 | Disposition: A | Discharge status: Home / Self Care | Payer: BC Managed Care – PPO | Attending: Emergency Medicine | Admitting: Emergency Medicine

## 2017-07-11 DIAGNOSIS — Y929 Unspecified place or not applicable: Secondary | ICD-10-CM | POA: Insufficient documentation

## 2017-07-11 DIAGNOSIS — Z79899 Other long term (current) drug therapy: Secondary | ICD-10-CM | POA: Insufficient documentation

## 2017-07-11 DIAGNOSIS — Z7982 Long term (current) use of aspirin: Secondary | ICD-10-CM | POA: Insufficient documentation

## 2017-07-11 DIAGNOSIS — R6883 Chills (without fever): Secondary | ICD-10-CM | POA: Diagnosis not present

## 2017-07-11 DIAGNOSIS — S99911A Unspecified injury of right ankle, initial encounter: Secondary | ICD-10-CM | POA: Diagnosis present

## 2017-07-11 DIAGNOSIS — S93401A Sprain of unspecified ligament of right ankle, initial encounter: Secondary | ICD-10-CM | POA: Diagnosis not present

## 2017-07-11 DIAGNOSIS — Y9367 Activity, basketball: Secondary | ICD-10-CM | POA: Diagnosis not present

## 2017-07-11 DIAGNOSIS — Y999 Unspecified external cause status: Secondary | ICD-10-CM | POA: Diagnosis not present

## 2017-07-11 DIAGNOSIS — X501XXA Overexertion from prolonged static or awkward postures, initial encounter: Secondary | ICD-10-CM | POA: Diagnosis not present

## 2017-07-11 HISTORY — DX: Other allergy status, other than to drugs and biological substances: Z91.09

## 2017-07-11 NOTE — ED Provider Notes (Signed)
[facility] Provider Note   CSN: [id number] Arrival date & time: 07/11/17  0115     History   Chief Complaint Chief Complaint  Patient presents with  . Fever  . Ankle Injury    HPI [facility] is a 52 y.o. male.  Patient is a 52 year old male with past medical history of GERD.  He presents today for evaluation of a right ankle injury and chills.  States he was playing basketball this afternoon.  When he came down from getting a rebound, he stepped on another player, then rolled his ankle.  He is complaining of pain and swelling in his right ankle.  Is having difficulty bearing weight.  This evening, he felt chilled and presents for evaluation of this as well.  He denies any specific complaints such as cough, congestion, sore throat, earache, vomiting, or diarrhea.  He denies any ill contacts.  The history is provided by the patient.    Past Medical History:  Diagnosis Date  . Environmental allergies     There are no active problems to display for this patient.   History reviewed. No pertinent surgical history.      Home Medications    Prior to Admission medications   Medication Sig Start Date End Date Taking? Authorizing Provider  aspirin 81 MG chewable tablet Chew 324 mg by mouth daily.    [provider]  nitroGLYCERIN (NITROSTAT) 0.4 MG SL tablet Place 0.4 mg under the tongue every 5 (five) minutes as needed. One dose    [provider]  omeprazole (PRILOSEC) 20 MG capsule Take 20 mg by mouth daily.    [provider]  OVER THE COUNTER MEDICATION Take 1 tablet by mouth 2 (two) times daily. OTC testosterone booster    [provider]  ranitidine (ZANTAC) 150 MG capsule Take 150 mg by mouth 2 (two) times daily.    [provider]    Family History No family history on file.  Social History Social History   Tobacco Use  . Smoking status: Never Smoker  . Smokeless tobacco: Never Used    Substance Use Topics  . Alcohol use: Never    Frequency: Never  . Drug use: Never     Allergies   Patient has no known allergies.   Review of Systems Review of Systems  All other systems reviewed and are negative.    Physical Exam Updated Vital Signs BP (!) 143/88 (BP Location: Right Arm)   Pulse 74   Temp 99.5 F (37.5 C) (Oral)   Resp 18   Ht 6\' 2"  (1.88 m)   Wt 129.3 kg (285 lb)   SpO2 98%   BMI 36.59 kg/m   Physical Exam  Constitutional: He is oriented to person, place, and time. He appears well-developed and well-nourished. No distress.  HENT:  Head: Normocephalic and atraumatic.  Mouth/Throat: Oropharynx is clear and moist.  TMs are clear bilaterally.  Neck: Normal range of motion. Neck supple.  Cardiovascular: Normal rate and regular rhythm. Exam reveals no friction rub.  No murmur heard. Pulmonary/Chest: Effort normal and breath sounds normal. No respiratory distress. He has no wheezes. He has no rales.  Abdominal: Soft. Bowel sounds are normal. He exhibits no distension. There is no tenderness.  Musculoskeletal: Normal range of motion. He exhibits no edema.  The right ankle has swelling and tenderness over the lateral malleolus and anterior talofibular ligament.  The ankle joint appears stable.  Distal PMS is intact.  Lymphadenopathy:    He has no cervical adenopathy.  Neurological: He is alert and oriented to person, place, and time. Coordination normal.  Skin: Skin is warm and dry. He is not diaphoretic.  Nursing note and vitals reviewed.    ED Treatments / Results  Labs (all labs ordered are listed, but only abnormal results are displayed) Labs Reviewed - No data to display  EKG None  Radiology Dg Ankle Complete Right  Result Date: 07/11/2017 CLINICAL DATA:  Pain and swelling to the lateral malleolus and anterior ankle after basketball injury. EXAM: RIGHT ANKLE - COMPLETE 3+ VIEW COMPARISON:  None. FINDINGS: There is an acute mildly  displaced fracture of the inferior aspect of the cuboidal bone. Lateral soft tissue swelling. Focal sclerosis in the mid calcaneus likely representing benign bone island. IMPRESSION: Acute fracture of the cuboidal bone. Lateral soft tissue swelling over the right ankle. Electronically Signed   By: Burman NievesWilliam  Stevens M.D.   On: 07/11/2017 02:08    Procedures Procedures (including critical care time)  Medications Ordered in ED Medications - No data to display   Initial Impression / Assessment and Plan / ED Course  I have reviewed the triage vital signs and the nursing notes.  Pertinent labs & imaging results that were available during my care of the patient were reviewed by me and considered in my medical decision making (see chart for details).  Patient presenting with an ankle injury that appears to be a sprain.  They make mention of a cuboidal fracture, however he is not tender in this area and this does not fit the clinical picture.  I highly suspect this fracture to be old.  His ankle sprain will be treated with crutches, Ace, rest, ice, compression, and time.  I see no source of his chills.  His temp is 99.5.  He has no other complaints that would explain this feeling and I see no indication for additional workup at this time.  His vitals are stable and does not appear septic or acutely ill.  This may well be a viral infection.  Final Clinical Impressions(s) / ED Diagnoses   Final diagnoses:  None    ED Discharge Orders    None       Geoffery Lyonselo, Chrisanna Mishra, MD 07/11/17 [phone]

## 2017-07-11 NOTE — ED Triage Notes (Signed)
Pt reports he injured his right ankle in a basketball game last evening, state he went home and rested but awoke feeling like he had a fever.  Pt has had 2 doses of tylenol since then.

## 2017-07-11 NOTE — Discharge Instructions (Addendum)
Rest.  Ice for 20 minutes every 2 hours while awake for the next 2 days.  Elevate your leg is much as possible.  Wear Ace bandage for comfort and support and use crutches to assist with ambulation.  If your symptoms are not improving in the next 1-2 weeks, follow-up with your primary doctor for a recheck.  Drink plenty of fluids and get plenty of rest.  Take ibuprofen 600 mg every 6 hours as needed for pain or fever.  Return to the emergency department if your symptoms significantly worsen or change.

## 2017-09-17 ENCOUNTER — Ambulatory Visit (INDEPENDENT_AMBULATORY_CARE_PROVIDER_SITE_OTHER): Payer: BC Managed Care – PPO | Admitting: Physical Therapy

## 2017-09-17 ENCOUNTER — Encounter: Payer: Self-pay | Admitting: Physical Therapy

## 2017-09-17 DIAGNOSIS — R2689 Other abnormalities of gait and mobility: Secondary | ICD-10-CM | POA: Diagnosis not present

## 2017-09-17 DIAGNOSIS — M25662 Stiffness of left knee, not elsewhere classified: Secondary | ICD-10-CM | POA: Diagnosis not present

## 2017-09-17 DIAGNOSIS — M6281 Muscle weakness (generalized): Secondary | ICD-10-CM | POA: Diagnosis not present

## 2017-09-17 DIAGNOSIS — M25562 Pain in left knee: Secondary | ICD-10-CM | POA: Diagnosis not present

## 2017-09-17 NOTE — Therapy (Signed)
[facility] [address] [place], [place], [address] Phone: [phone]   Fax:  [phone]  Physical Therapy Evaluation  Patient Details  Name: Randall Hooper MRN: [id number] Date of Birth: 06-Dec-1965 Referring Provider: Gavin PottersSteven B Larson   Encounter Date: 09/17/2017  PT End of Session - 09/17/17 1527    Visit Number  1    Number of Visits  14    Date for PT Re-Evaluation  11/05/17    Authorization Type  [place]    Authorization - Number of Visits  15    PT Start Time  1103    PT Stop Time  1150    PT Time Calculation (min)  47 min    Activity Tolerance  Patient tolerated treatment well;Patient limited by pain    Behavior During Therapy  [facility] for tasks assessed/performed       Past Medical History:  Diagnosis Date  . Environmental allergies   . GERD (gastroesophageal reflux disease)     No past surgical history on file.  There were no vitals filed for this visit.   Subjective Assessment - 09/17/17 1523    Subjective  Pt had surgery on 08/21/17, for L knee: open tibial tuberosity ossicle excision, Arthroscopic partial lateral meniscectomy with chondroplasty and [place] [facility]. He has had one nurse visit since surgery for stich/staple removal, and dressing change. Pt is wearing ace wrap today, no AD. He is accompanied by wife, not driving yet. He works full time/duty at [employer]  [employer], will be out of work for [employer] moths, hoping to retur nto full duty at that time.     Patient is accompained by:  Family member    Limitations  Sitting;Lifting;Standing;Walking;House hold activities    Patient Stated Goals  decrease pain, improve mobility, walking, return to work.     Currently in Pain?  Yes    Pain Score  5     Pain Location  Knee    Pain Orientation  Left    Pain Descriptors / Indicators  Aching    Pain Type  Acute pain    Pain Onset  More than a month ago    Pain Frequency  Intermittent    Aggravating Factors    movement, bending, sitting, walking.     Pain Relieving Factors  rest.          [facility] PT Assessment - 09/17/17 0001      Assessment   Medical Diagnosis  L knee Pain    Referring Provider  Gavin PottersSteven B Larson    Onset Date/Surgical Date  08/21/17    Prior Therapy  no      Precautions   Precautions  Knee      Restrictions   Weight Bearing Restrictions  No      Balance Screen   Has the patient fallen in the past 6 months  No      Prior Function   Level of Independence  Independent with basic ADLs      Cognition   Overall Cognitive Status  Within Functional Limits for tasks assessed      ROM / Strength   AROM / PROM / Strength  AROM;PROM;Strength      AROM   AROM Assessment Site  Knee    Right/Left Knee  Left    Left Knee Extension  -10    Left Knee Flexion  80      PROM   PROM Assessment Site  Knee    Right/Left Knee  Left    Left Knee Extension  -7    Left Knee Flexion  85      Strength   Strength Assessment Site  Knee    Right/Left Knee  Left    Left Knee Flexion  4-/5    Left Knee Extension  3-/5      Palpation   Patella mobility  decreased    Palpation comment  Healing incisions, Medial scope portal still with scabbing, rest of incisions healing well. Mild swelling.       Ambulation/Gait   Gait Comments  Decreased weight acceptance, TKE, speed, and stance time.                 Objective measurements completed on examination: See above findings.      [facility] Adult PT Treatment/Exercise - 09/17/17 0001      Exercises   Exercises  Knee/Hip      Knee/Hip Exercises: Stretches   Active Hamstring Stretch  3 reps;30 seconds;Left      Knee/Hip Exercises: Seated   Heel Slides  10 reps      Knee/Hip Exercises: Supine   Quad Sets  20 reps    Heel Slides  20 reps    Heel Slides Limitations  x20 regular, x10 with strap    Straight Leg Raises  2 sets;5 sets      Manual Therapy   Manual Therapy  Passive ROM    Passive ROM  PROM For L knee flex and  ext             PT Education - 09/17/17 1527    Education Details  PT POC, HEP, use of crutch    Person(s) Educated  Patient;Spouse    Methods  Explanation;Demonstration;Verbal cues;Handout    Comprehension  Verbalized understanding;Verbal cues required;Tactile cues required;Need further instruction       PT Short Term Goals - 09/17/17 1531      PT SHORT TERM GOAL #1   Title  Pt to report decreased pain to 3/10 with activity     Time  2    Period  Weeks    Status  New    Target Date  10/01/17      PT SHORT TERM GOAL #2   Title  Pt to demo increased L knee flexion ROM by 10 degrees     Time  2    Period  Weeks    Status  New    Target Date  10/01/17        PT Long Term Goals - 09/17/17 1535      PT LONG TERM GOAL #1   Title  Pt to demo increased L knee AROM to be WNL for flexion and extension, to improve ability for gait and stairs.     Time  7    Period  Weeks    Status  New    Target Date  11/05/17      PT LONG TERM GOAL #2   Title  Pt to demo increased strength of L knee and hip to at least 4+/5 to improve stability and ability for gait.     Time  7    Period  Weeks    Status  New    Target Date  11/05/17      PT LONG TERM GOAL #3   Title  Pt to report decreased pain to 0-2/10 with standing activity and ambulation, to improve ability for  return to work duties.     Time  7    Period  Weeks    Status  New    Target Date  11/05/17      PT LONG TERM GOAL #4   Title  Pt to demo ability for navigation of 5 steps with handrail, to improve ability for community navigation and work duties.     Time  7    Period  Weeks    Status  New    Target Date  11/05/17             Plan - 09/17/17 1538    Clinical Impression Statement  Pt presents with primary complaint of increased pain in L knee, following surgery on 08/21/17 for L knee arthroscopy. Pt with healing incisions at anterior knee. He has mild swelling, mostly at area under longest incision. Pt with  decreased ROM, with joint stiffness, and surrounding muscle tightness. He has decreased strength, stability, and ability for ambulation. He has decreased gait mechanics, with decreased TKE, decreased stance time, weight acceptance, and  decreased balance. Pt with decreased ability for full functional activities, due to pain and deficits, and inability for work duties at this time. Pt to benefit from skilled PT to improve deficits, and return to PLOF.     Clinical Presentation  Stable    Clinical Decision Making  Low    Rehab Potential  Good    PT Frequency  2x / week    PT Duration  8 weeks    PT Treatment/Interventions  ADLs/Self Care Home Management;Cryotherapy;Electrical Stimulation;Iontophoresis 4mg /ml Dexamethasone;Moist Heat;Therapeutic activities;Functional mobility training;Stair training;Gait training;DME Instruction;Ultrasound;Therapeutic exercise;Balance training;Neuromuscular re-education;Patient/family education;Dry needling;Passive range of motion;Manual techniques;Taping    Consulted and Agree with Plan of Care  Patient       Patient will benefit from skilled therapeutic intervention in order to improve the following deficits and impairments:  Abnormal gait, Decreased endurance, Hypomobility, Decreased scar mobility, Decreased activity tolerance, Decreased strength, Pain, Increased muscle spasms, Difficulty walking, Decreased mobility, Decreased balance, Decreased range of motion, Postural dysfunction, Impaired flexibility  Visit Diagnosis: Acute pain of left knee  Stiffness of knee joint, left  Other abnormalities of gait and mobility  Muscle weakness     Problem List There are no active problems to display for this patient.   Sedalia Muta, PT, DPT 3:50 PM  09/17/17    [facility] [address] [place], [place], [address] Phone: [phone]   Fax:  [phone]  Name: CARSYN TAUBMAN MRN: [id number] Date of Birth:  12/01/1965

## 2017-09-17 NOTE — Patient Instructions (Signed)
Access Code: [email or web address]  URL: [email or web address]  Date: 09/17/2017  Prepared by: Sedalia MutaLauren Caitlin Ainley   Exercises  Supine Heel Slides - 10 reps - 2 sets - 3x daily  Supine Heel Slide with Strap - 10 reps - 2 sets - 3x daily  Seated Knee Flexion Extension AROM - 10 reps - 2 sets - 3x daily  Supine Quad Set - 10 reps - 2 sets - 3x daily  Seated Hamstring Stretch - 3 reps - 30 hold - 3x daily  Supine Straight Leg Raises - 5 reps - 2 sets - 2x daily

## 2017-09-20 ENCOUNTER — Encounter: Payer: Self-pay | Admitting: Physical Therapy

## 2017-09-20 ENCOUNTER — Ambulatory Visit (INDEPENDENT_AMBULATORY_CARE_PROVIDER_SITE_OTHER): Payer: BC Managed Care – PPO | Admitting: Physical Therapy

## 2017-09-20 DIAGNOSIS — M25562 Pain in left knee: Secondary | ICD-10-CM

## 2017-09-20 DIAGNOSIS — M6281 Muscle weakness (generalized): Secondary | ICD-10-CM | POA: Diagnosis not present

## 2017-09-20 DIAGNOSIS — M25662 Stiffness of left knee, not elsewhere classified: Secondary | ICD-10-CM | POA: Diagnosis not present

## 2017-09-20 DIAGNOSIS — R2689 Other abnormalities of gait and mobility: Secondary | ICD-10-CM

## 2017-09-20 NOTE — Therapy (Signed)
[facility] [address] [place], [place], [address] Phone: [phone]   Fax:  [phone]  Physical Therapy Treatment  Patient Details  Name: Randall Hooper MRN: [id number] Date of Birth: 1965-06-26 Referring Provider: Gavin Potters   Encounter Date: 09/20/2017  PT End of Session - 09/20/17 0811    Visit Number  2    Number of Visits  14    Date for PT Re-Evaluation  11/05/17    Authorization Type  [facility]    Authorization - Number of Visits  15    PT Start Time  0805    PT Stop Time  0847    PT Time Calculation (min)  42 min    Activity Tolerance  Patient tolerated treatment well;Patient limited by pain    Behavior During Therapy  [facility] for tasks assessed/performed       Past Medical History:  Diagnosis Date  . Environmental allergies   . GERD (gastroesophageal reflux disease)     History reviewed. No pertinent surgical history.  There were no vitals filed for this visit.  Subjective Assessment - 09/20/17 0810    Subjective  Pt with no new complaints today. He has been doing HEP.     Currently in Pain?  Yes    Pain Score  4     Pain Location  Knee    Pain Orientation  Left    Pain Descriptors / Indicators  Aching    Pain Type  Acute pain    Pain Onset  More than a month ago    Pain Frequency  Intermittent         [facility] PT Assessment - 09/20/17 0812      AROM   Left Knee Flexion  105                   [facility] Adult PT Treatment/Exercise - 09/20/17 9528      Ambulation/Gait   Gait Comments  Decreased weight acceptance, TKE, speed, and stance time.       Exercises   Exercises  Knee/Hip      Knee/Hip Exercises: Stretches   Active Hamstring Stretch  3 reps;30 seconds;Left      Knee/Hip Exercises: Aerobic   Stationary Bike  L0 x5 min, rocking for ROM      Knee/Hip Exercises: Standing   Gait Training  35 ft x8 with practice for heel to toe gait;     Other Standing Knee Exercises  L/R weight shifts  x20; Fwd/Bwd weight shifts onto L LE/ Pre-gait/ staggered stance x20;       Knee/Hip Exercises: Seated   Long Arc Quad  2 sets;10 reps    Heel Slides  --      Knee/Hip Exercises: Supine   Quad Sets  20 reps    Heel Slides  20 reps    Heel Slides Limitations  x20 regular, x10 with strap    Straight Leg Raises  5 sets;4 sets      Manual Therapy   Manual Therapy  Passive ROM;Soft tissue mobilization    Soft tissue mobilization  STM lateral knee/ ITB    Passive ROM  PROM For L knee flex and ext in supine; knee flexion in prone;                PT Short Term Goals - 09/17/17 1531      PT SHORT TERM GOAL #1   Title  Pt to report decreased pain  to 3/10 with activity     Time  2    Period  Weeks    Status  New    Target Date  10/01/17      PT SHORT TERM GOAL #2   Title  Pt to demo increased L knee flexion ROM by 10 degrees     Time  2    Period  Weeks    Status  New    Target Date  10/01/17        PT Long Term Goals - 09/17/17 1535      PT LONG TERM GOAL #1   Title  Pt to demo increased L knee AROM to be WNL for flexion and extension, to improve ability for gait and stairs.     Time  7    Period  Weeks    Status  New    Target Date  11/05/17      PT LONG TERM GOAL #2   Title  Pt to demo increased strength of L knee and hip to at least 4+/5 to improve stability and ability for gait.     Time  7    Period  Weeks    Status  New    Target Date  11/05/17      PT LONG TERM GOAL #3   Title  Pt to report decreased pain to 0-2/10 with standing activity and ambulation, to improve ability for return to work duties.     Time  7    Period  Weeks    Status  New    Target Date  11/05/17      PT LONG TERM GOAL #4   Title  Pt to demo ability for navigation of 5 steps with handrail, to improve ability for community navigation and work duties.     Time  7    Period  Weeks    Status  New    Target Date  11/05/17            Plan - 09/20/17 0906    Clinical  Impression Statement  Pt with improving flexion and extension ROM, flexion up to 105 today. Pt cued for decreasing fwd and lateral trunk flexion with weight acceptance on L, will benefit from continued strengthening and gait training to improve this. Plan to progress ROM and strength as tolerated.     Rehab Potential  Good    PT Frequency  2x / week    PT Duration  8 weeks    PT Treatment/Interventions  ADLs/Self Care Home Management;Cryotherapy;Electrical Stimulation;Iontophoresis 4mg /ml Dexamethasone;Moist Heat;Therapeutic activities;Functional mobility training;Stair training;Gait training;DME Instruction;Ultrasound;Therapeutic exercise;Balance training;Neuromuscular re-education;Patient/family education;Dry needling;Passive range of motion;Manual techniques;Taping    Consulted and Agree with Plan of Care  Patient       Patient will benefit from skilled therapeutic intervention in order to improve the following deficits and impairments:  Abnormal gait, Decreased endurance, Hypomobility, Decreased scar mobility, Decreased activity tolerance, Decreased strength, Pain, Increased muscle spasms, Difficulty walking, Decreased mobility, Decreased balance, Decreased range of motion, Postural dysfunction, Impaired flexibility  Visit Diagnosis: Acute pain of left knee  Stiffness of knee joint, left  Other abnormalities of gait and mobility  Muscle weakness     Problem List There are no active problems to display for this patient.   Sedalia Muta, PT, DPT 9:08 AM  09/20/17   [facility] [address] [place], [place], [address] Phone: [phone]   Fax:  [phone]  Name: Anjelo  Tiffany KocherS Klunk MRN: [id number] Date of Birth: [birth year]

## 2017-09-24 ENCOUNTER — Ambulatory Visit (INDEPENDENT_AMBULATORY_CARE_PROVIDER_SITE_OTHER): Payer: BC Managed Care – PPO | Admitting: Physical Therapy

## 2017-09-24 ENCOUNTER — Encounter: Payer: Self-pay | Admitting: Physical Therapy

## 2017-09-24 DIAGNOSIS — M25662 Stiffness of left knee, not elsewhere classified: Secondary | ICD-10-CM

## 2017-09-24 DIAGNOSIS — M25562 Pain in left knee: Secondary | ICD-10-CM | POA: Diagnosis not present

## 2017-09-24 DIAGNOSIS — M6281 Muscle weakness (generalized): Secondary | ICD-10-CM

## 2017-09-24 DIAGNOSIS — R2689 Other abnormalities of gait and mobility: Secondary | ICD-10-CM

## 2017-09-24 NOTE — Therapy (Signed)
[facility] [address] , [place], [address] Phone: [phone]   Fax:  [phone]  Physical Therapy Treatment  Patient Details  Name: Randall Hooper S Adamski MRN: [id number] Date of Birth: 09-02-1965 Referring Provider: Gavin PottersSteven B Larson   Encounter Date: 09/24/2017  PT End of Session - 09/24/17 0935    Visit Number  3    Number of Visits  14    Date for PT Re-Evaluation  11/05/17    Authorization Type  [facility]    Authorization - Number of Visits  15    PT Start Time  0932    PT Stop Time  1015    PT Time Calculation (min)  43 min    Activity Tolerance  Patient tolerated treatment well;Patient limited by pain    Behavior During Therapy  [facility] for tasks assessed/performed       Past Medical History:  Diagnosis Date  . Environmental allergies   . GERD (gastroesophageal reflux disease)     History reviewed. No pertinent surgical history.  There were no vitals filed for this visit.  Subjective Assessment - 09/24/17 0934    Subjective  Pt states minimal pain.     Currently in Pain?  Yes    Pain Score  2     Pain Location  Knee    Pain Orientation  Left    Pain Descriptors / Indicators  Aching    Pain Type  Acute pain    Pain Onset  More than a month ago    Pain Frequency  Intermittent                       [facility] Adult PT Treatment/Exercise - 09/24/17 [id number]      Ambulation/Gait   Gait Comments  Decreased weight acceptance, TKE, speed, and stance time.       Exercises   Exercises  Knee/Hip      Knee/Hip Exercises: Stretches   Active Hamstring Stretch  3 reps;30 seconds;Left      Knee/Hip Exercises: Aerobic   Stationary Bike  L1 x 8 min;       Knee/Hip Exercises: Standing   Forward Step Up  10 reps;Step Height: 4";Left    Gait Training  35 ft x8 with practice for heel to toe gait;     Other Standing Knee Exercises  --      Knee/Hip Exercises: Seated   Long Arc Quad  2 sets;10 reps    Hamstring Curl  20  reps    Hamstring Limitations  RTB       Knee/Hip Exercises: Supine   Quad Sets  20 reps    Heel Slides  20 reps    Heel Slides Limitations  x20 regular, x10 with strap    Straight Leg Raises  2 sets;10 reps      Knee/Hip Exercises: Prone   Hamstring Curl  10 reps      Manual Therapy   Manual Therapy  Passive ROM;Soft tissue mobilization    Soft tissue mobilization  --    Passive ROM  PROM For L knee flex and ext in supine; knee flexion in prone;                PT Short Term Goals - 09/17/17 1531      PT SHORT TERM GOAL #1   Title  Pt to report decreased pain to 3/10 with activity     Time  2  Period  Weeks    Status  New    Target Date  10/01/17      PT SHORT TERM GOAL #2   Title  Pt to demo increased L knee flexion ROM by 10 degrees     Time  2    Period  Weeks    Status  New    Target Date  10/01/17        PT Long Term Goals - 09/17/17 1535      PT LONG TERM GOAL #1   Title  Pt to demo increased L knee AROM to be WNL for flexion and extension, to improve ability for gait and stairs.     Time  7    Period  Weeks    Status  New    Target Date  11/05/17      PT LONG TERM GOAL #2   Title  Pt to demo increased strength of L knee and hip to at least 4+/5 to improve stability and ability for gait.     Time  7    Period  Weeks    Status  New    Target Date  11/05/17      PT LONG TERM GOAL #3   Title  Pt to report decreased pain to 0-2/10 with standing activity and ambulation, to improve ability for return to work duties.     Time  7    Period  Weeks    Status  New    Target Date  11/05/17      PT LONG TERM GOAL #4   Title  Pt to demo ability for navigation of 5 steps with handrail, to improve ability for community navigation and work duties.     Time  7    Period  Weeks    Status  New    Target Date  11/05/17            Plan - 09/24/17 1210    Clinical Impression Statement  Pt with improving ability for ROM and strengthening ther ex. Pt  able to achieve full revolution on bike today as well. Pt with improved gait mechanics today, with improving TKE, weight acceptance, and decreased lateral trunk lean. Pt progressing well.     Rehab Potential  Good    PT Frequency  2x / week    PT Duration  8 weeks    PT Treatment/Interventions  ADLs/Self Care Home Management;Cryotherapy;Electrical Stimulation;Iontophoresis 4mg /ml Dexamethasone;Moist Heat;Therapeutic activities;Functional mobility training;Stair training;Gait training;DME Instruction;Ultrasound;Therapeutic exercise;Balance training;Neuromuscular re-education;Patient/family education;Dry needling;Passive range of motion;Manual techniques;Taping    Consulted and Agree with Plan of Care  Patient       Patient will benefit from skilled therapeutic intervention in order to improve the following deficits and impairments:  Abnormal gait, Decreased endurance, Hypomobility, Decreased scar mobility, Decreased activity tolerance, Decreased strength, Pain, Increased muscle spasms, Difficulty walking, Decreased mobility, Decreased balance, Decreased range of motion, Postural dysfunction, Impaired flexibility  Visit Diagnosis: Acute pain of left knee  Stiffness of knee joint, left  Other abnormalities of gait and mobility  Muscle weakness     Problem List There are no active problems to display for this patient.   Sedalia Muta, PT, DPT 12:13 PM  09/24/17    [facility] [address] [place], [place], [address] Phone: [phone]   Fax:  [phone]  Name: JAKYREN FLUEGGE MRN: [id number] Date of Birth: [birth year]

## 2017-09-26 ENCOUNTER — Encounter: Payer: Self-pay | Admitting: Physical Therapy

## 2017-09-26 ENCOUNTER — Ambulatory Visit (INDEPENDENT_AMBULATORY_CARE_PROVIDER_SITE_OTHER): Payer: BC Managed Care – PPO | Admitting: Physical Therapy

## 2017-09-26 DIAGNOSIS — M25562 Pain in left knee: Secondary | ICD-10-CM | POA: Diagnosis not present

## 2017-09-26 DIAGNOSIS — M25662 Stiffness of left knee, not elsewhere classified: Secondary | ICD-10-CM

## 2017-09-26 DIAGNOSIS — M6281 Muscle weakness (generalized): Secondary | ICD-10-CM

## 2017-09-26 DIAGNOSIS — R2689 Other abnormalities of gait and mobility: Secondary | ICD-10-CM

## 2017-09-26 NOTE — Therapy (Signed)
[facility] [address] [place], [place], [address] Phone: [phone]   Fax:  [phone]  Physical Therapy Treatment  Patient Details  Name: Randall Hooper MRN: [id number] Date of Birth: February 18, 1966 Referring Provider: Gavin Potters   Encounter Date: 09/26/2017  PT End of Session - 09/26/17 0928    Visit Number  4    Number of Visits  14    Date for PT Re-Evaluation  11/05/17    Authorization Type  [place]    Authorization - Number of Visits  15    PT Start Time  0930    PT Stop Time  1010    PT Time Calculation (min)  40 min    Activity Tolerance  Patient tolerated treatment well;Patient limited by pain    Behavior During Therapy  [facility] for tasks assessed/performed       Past Medical History:  Diagnosis Date  . Environmental allergies   . GERD (gastroesophageal reflux disease)     History reviewed. No pertinent surgical history.  There were no vitals filed for this visit.      [facility] PT Assessment - 09/26/17 0928      AROM   Left Knee Flexion  113                   [facility] Adult PT Treatment/Exercise - 09/26/17 6644      Ambulation/Gait   Gait Comments  --      Exercises   Exercises  Knee/Hip      Knee/Hip Exercises: Stretches   Active Hamstring Stretch  3 reps;30 seconds;Left    Active Hamstring Stretch Limitations  with self overpressure      Knee/Hip Exercises: Aerobic   Stationary Bike  L1 x 8 min;       Knee/Hip Exercises: Standing   Hip Flexion  20 reps;Knee bent    Forward Step Up  10 reps;Step Height: 4";Left    Gait Training  35 ft x8 with practice for heel to toe gait;       Knee/Hip Exercises: Seated   Long Arc Quad  2 sets;10 reps    Hamstring Curl  20 reps    Hamstring Limitations  RTB       Knee/Hip Exercises: Supine   Quad Sets  20 reps    Heel Slides  20 reps    Heel Slides Limitations  --    Straight Leg Raises  --      Knee/Hip Exercises: Prone   Hamstring Curl  --       Manual Therapy   Manual Therapy  Passive ROM;Soft tissue mobilization;Joint mobilization    Joint Mobilization  to increase L knee extension grade 3    Soft tissue mobilization  STM/DTM/ IASTM to L lateral knee and quad    Passive ROM  PROM For L knee flex and ext in supine;                PT Short Term Goals - 09/17/17 1531      PT SHORT TERM GOAL #1   Title  Pt to report decreased pain to 3/10 with activity     Time  2    Period  Weeks    Status  New    Target Date  10/01/17      PT SHORT TERM GOAL #2   Title  Pt to demo increased L knee flexion ROM by 10 degrees     Time  2    Period  Weeks    Status  New    Target Date  10/01/17        PT Long Term Goals - 09/17/17 1535      PT LONG TERM GOAL #1   Title  Pt to demo increased L knee AROM to be WNL for flexion and extension, to improve ability for gait and stairs.     Time  7    Period  Weeks    Status  New    Target Date  11/05/17      PT LONG TERM GOAL #2   Title  Pt to demo increased strength of L knee and hip to at least 4+/5 to improve stability and ability for gait.     Time  7    Period  Weeks    Status  New    Target Date  11/05/17      PT LONG TERM GOAL #3   Title  Pt to report decreased pain to 0-2/10 with standing activity and ambulation, to improve ability for return to work duties.     Time  7    Period  Weeks    Status  New    Target Date  11/05/17      PT LONG TERM GOAL #4   Title  Pt to demo ability for navigation of 5 steps with handrail, to improve ability for community navigation and work duties.     Time  7    Period  Weeks    Status  New    Target Date  11/05/17            Plan - 09/26/17 1014    Clinical Impression Statement  Pt showing improvements in ROM each visit. He has tenderness and mild tightness at lateral knee and quad, addressed with IASTM today. He also has tenderness to palpate distal anterior incision at tibial tuberosity. Incisions all well healed. Plan  to progress ROm, strength, and gait as tolerated.     Rehab Potential  Good    PT Frequency  2x / week    PT Duration  8 weeks    PT Treatment/Interventions  ADLs/Self Care Home Management;Cryotherapy;Electrical Stimulation;Iontophoresis 4mg /ml Dexamethasone;Moist Heat;Therapeutic activities;Functional mobility training;Stair training;Gait training;DME Instruction;Ultrasound;Therapeutic exercise;Balance training;Neuromuscular re-education;Patient/family education;Dry needling;Passive range of motion;Manual techniques;Taping    Consulted and Agree with Plan of Care  Patient       Patient will benefit from skilled therapeutic intervention in order to improve the following deficits and impairments:  Abnormal gait, Decreased endurance, Hypomobility, Decreased scar mobility, Decreased activity tolerance, Decreased strength, Pain, Increased muscle spasms, Difficulty walking, Decreased mobility, Decreased balance, Decreased range of motion, Postural dysfunction, Impaired flexibility  Visit Diagnosis: Acute pain of left knee  Stiffness of knee joint, left  Other abnormalities of gait and mobility  Muscle weakness     Problem List There are no active problems to display for this patient.  Sedalia MutaLauren Drinda Belgard, PT, DPT 10:16 AM  09/26/17    [facility] [address] [place], [place], [address] Phone: [phone]   Fax:  [phone]  Name: Randall Hooper MRN: [id number] Date of Birth: 1965-07-06

## 2017-09-30 ENCOUNTER — Ambulatory Visit (INDEPENDENT_AMBULATORY_CARE_PROVIDER_SITE_OTHER): Payer: BC Managed Care – PPO | Admitting: Physical Therapy

## 2017-09-30 ENCOUNTER — Encounter: Payer: Self-pay | Admitting: Physical Therapy

## 2017-09-30 DIAGNOSIS — M6281 Muscle weakness (generalized): Secondary | ICD-10-CM

## 2017-09-30 DIAGNOSIS — M25562 Pain in left knee: Secondary | ICD-10-CM

## 2017-09-30 DIAGNOSIS — R2689 Other abnormalities of gait and mobility: Secondary | ICD-10-CM | POA: Diagnosis not present

## 2017-09-30 DIAGNOSIS — M25662 Stiffness of left knee, not elsewhere classified: Secondary | ICD-10-CM | POA: Diagnosis not present

## 2017-09-30 NOTE — Therapy (Signed)
[facility] [address] [place], [place], [address] Phone: [phone]   Fax:  [phone]  Physical Therapy Treatment  Patient Details  Name: Rosana Hoesrvin S Stiner MRN: [id number] Date of Birth: Jul 27, 1965 Referring Provider: Gavin PottersSteven B Larson   Encounter Date: 09/30/2017  PT End of Session - 09/30/17 1024    Visit Number  5    Number of Visits  14    Date for PT Re-Evaluation  11/05/17    Authorization Type  [place]    Authorization - Number of Visits  15    PT Start Time  1015    PT Stop Time  1055    PT Time Calculation (min)  40 min    Activity Tolerance  Patient tolerated treatment well;Patient limited by pain    Behavior During Therapy  [facility] assessed/performed       Past Medical History:  Diagnosis Date  . Environmental allergies   . GERD (gastroesophageal reflux disease)     History reviewed. No pertinent surgical history.  There were no vitals filed for this visit.  Subjective Assessment - 09/30/17 1023    Subjective  Pt states mild pain yesterday. He reports not doing HEP as often as recommended.     Currently in Pain?  Yes    Pain Score  2     Pain Location  Knee    Pain Orientation  Left    Pain Descriptors / Indicators  Aching    Pain Type  Acute pain    Pain Onset  More than a month ago    Pain Frequency  Intermittent                       [facility] Adult PT Treatment/Exercise - 09/30/17 1021      Exercises   Exercises  Knee/Hip      Knee/Hip Exercises: Stretches   Active Hamstring Stretch  3 reps;30 seconds;Left    Active Hamstring Stretch Limitations  with self overpressure      Knee/Hip Exercises: Aerobic   Stationary Bike  L1 x 8 min;       Knee/Hip Exercises: Standing   Heel Raises  20 reps    Hip Flexion  20 reps;Knee bent    Forward Step Up  --    Gait Training  35 ft x8 with practice for heel to toe gait;     Other Standing Knee Exercises  L/R and A/P weight shifts x20 each;      Other Standing Knee Exercises  Pre-Gait Weight shifts with staggered stance x20; stepping / loading L LE x20;       Knee/Hip Exercises: Seated   Long Arc Quad  2 sets;10 reps    Long Arc Quad Weight  2 lbs.    Hamstring Curl  20 reps    Hamstring Limitations  GTB      Knee/Hip Exercises: Supine   Quad Sets  --    Heel Slides  20 reps    Heel Slides Limitations   x10 with strap    Straight Leg Raises  2 sets;10 reps      Modalities   Modalities  Iontophoresis      Iontophoresis   Type of Iontophoresis  Dexamethasone    Location  L anterior knee/ patella tendon    Time  6 hour patch      Manual Therapy   Manual Therapy  Passive ROM;Soft tissue mobilization;Joint mobilization  Joint Mobilization  to increase L knee extension grade 3    Soft tissue mobilization  --    Passive ROM  PROM For L knee flex and ext in supine;              PT Education - 09/30/17 1024    Education Details  HEP enforced.     Person(s) Educated  Patient    Methods  Explanation    Comprehension  Verbalized understanding       PT Short Term Goals - 09/17/17 1531      PT SHORT TERM GOAL #1   Title  Pt to report decreased pain to 3/10 with activity     Time  2    Period  Weeks    Status  New    Target Date  10/01/17      PT SHORT TERM GOAL #2   Title  Pt to demo increased L knee flexion ROM by 10 degrees     Time  2    Period  Weeks    Status  New    Target Date  10/01/17        PT Long Term Goals - 09/17/17 1535      PT LONG TERM GOAL #1   Title  Pt to demo increased L knee AROM to be WNL for flexion and extension, to improve ability for gait and stairs.     Time  7    Period  Weeks    Status  New    Target Date  11/05/17      PT LONG TERM GOAL #2   Title  Pt to demo increased strength of L knee and hip to at least 4+/5 to improve stability and ability for gait.     Time  7    Period  Weeks    Status  New    Target Date  11/05/17      PT LONG TERM GOAL #3   Title   Pt to report decreased pain to 0-2/10 with standing activity and ambulation, to improve ability for return to work duties.     Time  7    Period  Weeks    Status  New    Target Date  11/05/17      PT LONG TERM GOAL #4   Title  Pt to demo ability for navigation of 5 steps with handrail, to improve ability for community navigation and work duties.     Time  7    Period  Weeks    Status  New    Target Date  11/05/17            Plan - 09/30/17 1137    Clinical Impression Statement  Pt with mild soreness at anterior knee/ patella tendon today. Ionto done to distal tendon for pain relief. Pt encouraged to perform HEP more often, for improving ROM and strength. Pts ROM improving well, continues to have weakness in quad. Pt with mild improvments in decreasing L lateral trunk flexion with gait, after education, and practice with gait and pre-gait activities today. recommend continued care.     Rehab Potential  Good    PT Frequency  2x / week    PT Duration  8 weeks    PT Treatment/Interventions  ADLs/Self Care Home Management;Cryotherapy;Electrical Stimulation;Iontophoresis 4mg /ml Dexamethasone;Moist Heat;Therapeutic activities;Functional mobility training;Stair training;Gait training;DME Instruction;Ultrasound;Therapeutic exercise;Balance training;Neuromuscular re-education;Patient/family education;Dry needling;Passive range of motion;Manual techniques;Taping    Consulted and Agree with Plan of  Care  Patient       Patient will benefit from skilled therapeutic intervention in order to improve the following deficits and impairments:  Abnormal gait, Decreased endurance, Hypomobility, Decreased scar mobility, Decreased activity tolerance, Decreased strength, Pain, Increased muscle spasms, Difficulty walking, Decreased mobility, Decreased balance, Decreased range of motion, Postural dysfunction, Impaired flexibility  Visit Diagnosis: Acute pain of left knee  Stiffness of knee joint,  left  Other abnormalities of gait and mobility  Muscle weakness     Problem List There are no active problems to display for this patient.  Sedalia Muta, PT, DPT 11:40 AM  09/30/17    [facility] [address] [place], [place], [address] Phone: [phone]   Fax:  [phone]  Name: AMJAD FIKES MRN: [id number] Date of Birth: Oct 28, 1965

## 2017-10-03 ENCOUNTER — Encounter: Payer: Self-pay | Admitting: Physical Therapy

## 2017-10-03 ENCOUNTER — Ambulatory Visit (INDEPENDENT_AMBULATORY_CARE_PROVIDER_SITE_OTHER): Payer: BC Managed Care – PPO | Admitting: Physical Therapy

## 2017-10-03 DIAGNOSIS — M6281 Muscle weakness (generalized): Secondary | ICD-10-CM

## 2017-10-03 DIAGNOSIS — M25662 Stiffness of left knee, not elsewhere classified: Secondary | ICD-10-CM

## 2017-10-03 DIAGNOSIS — R2689 Other abnormalities of gait and mobility: Secondary | ICD-10-CM | POA: Diagnosis not present

## 2017-10-03 DIAGNOSIS — M25562 Pain in left knee: Secondary | ICD-10-CM | POA: Diagnosis not present

## 2017-10-03 NOTE — Therapy (Signed)
[facility] [address] [place], [place], [address] Phone: [phone]   Fax:  [phone]  Physical Therapy Treatment  Patient Details  Name: Randall Hooper MRN: [id number] Date of Birth: 1965/07/21 Referring Provider: Geraldo Docker   Encounter Date: 10/03/2017  PT End of Session - 10/03/17 0937    Visit Number  6    Number of Visits  14    Date for PT Re-Evaluation  11/05/17    Authorization Type  [place]    Authorization - Number of Visits  15    PT Start Time  [phone]    PT Stop Time  1012    PT Time Calculation (min)  38 min    Activity Tolerance  Patient tolerated treatment well;Patient limited by pain    Behavior During Therapy  [facility] for tasks assessed/performed       Past Medical History:  Diagnosis Date  . Environmental allergies   . GERD (gastroesophageal reflux disease)     History reviewed. No pertinent surgical history.  There were no vitals filed for this visit.  Subjective Assessment - 10/03/17 0936    Subjective  Pt states soreness/tightness at anterior knee.     Currently in Pain?  Yes    Pain Score  4     Pain Location  Knee    Pain Orientation  Left    Pain Descriptors / Indicators  Aching    Pain Type  Acute pain    Pain Onset  More than a month ago    Pain Frequency  Intermittent         [facility] PT Assessment - 10/03/17 0001      AROM   Left Knee Extension  0    Left Knee Flexion  120      Strength   Left Knee Flexion  4/5    Left Knee Extension  4-/5                   [facility] Adult PT Treatment/Exercise - 10/03/17 0939      Exercises   Exercises  Knee/Hip      Knee/Hip Exercises: Stretches   Active Hamstring Stretch  3 reps;30 seconds;Left    Active Hamstring Stretch Limitations  with self overpressure      Knee/Hip Exercises: Aerobic   Stationary Bike  L1 x 8 min;       Knee/Hip Exercises: Standing   Heel Raises  20 reps    Knee Flexion  15 reps    Hip Flexion  20  reps;Knee bent    Forward Step Up  10 reps;Step Height: 4";Left    Gait Training  35 ft x8 with practice for heel to toe gait;     Other Standing Knee Exercises  A/P weight shifts x20 each;     Other Standing Knee Exercises  --      Knee/Hip Exercises: Seated   Long Arc Quad  2 sets;10 reps    Long Arc Quad Weight  2 lbs.    Hamstring Curl  20 reps    Hamstring Limitations  GTB      Knee/Hip Exercises: Supine   Heel Slides  --    Heel Slides Limitations  --    Straight Leg Raises  3 sets;10 reps      Modalities   Modalities  Iontophoresis      Iontophoresis   Type of Iontophoresis  Dexamethasone    Location  L anterior knee/  patella tendon    Time  6 hour patch      Manual Therapy   Manual Therapy  Passive ROM;Soft tissue mobilization;Joint mobilization    Joint Mobilization  to increase L knee extension grade 3    Passive ROM  PROM For L knee flex and ext in supine;                PT Short Term Goals - 10/03/17 5621      PT SHORT TERM GOAL #1   Title  Pt to report decreased pain to 3/10 with activity     Time  2    Period  Weeks    Status  Partially Met      PT SHORT TERM GOAL #2   Title  Pt to demo increased L knee flexion ROM by 10 degrees     Time  2    Period  Weeks    Status  Achieved        PT Long Term Goals - 10/03/17 3086      PT LONG TERM GOAL #1   Title  Pt to demo increased L knee AROM to be WNL for flexion and extension, to improve ability for gait and stairs.     Time  7    Period  Weeks    Status  On-going      PT LONG TERM GOAL #2   Title  Pt to demo increased strength of L knee and hip to at least 4+/5 to improve stability and ability for gait.     Time  7    Period  Weeks    Status  On-going      PT LONG TERM GOAL #3   Title  Pt to report decreased pain to 0-2/10 with standing activity and ambulation, to improve ability for return to work duties.     Time  7    Period  Weeks    Status  On-going      PT LONG TERM GOAL #4    Title  Pt to demo ability for navigation of 5 steps with handrail, to improve ability for community navigation and work duties.     Time  7    Period  Weeks    Status  On-going            Plan - 10/03/17 1009    Clinical Impression Statement  Pt showing good improvements in ROM and ability for strengthening. He has mild strength deficits for quad, seen with ther ex today, and continues to have difficulty with strength for stair climbing. Pt has been educated and cued for improving TKE with gait. Pt continues to have mild stiffness in knee, and pain at anterior knee. Pt to benefit from continued care for imroving strength and gait, to resotre functional mobility, in order to return to work duties.     Rehab Potential  Good    PT Frequency  2x / week    PT Duration  8 weeks    PT Treatment/Interventions  ADLs/Self Care Home Management;Cryotherapy;Electrical Stimulation;Iontophoresis '4mg'$ /ml Dexamethasone;Moist Heat;Therapeutic activities;Functional mobility training;Stair training;Gait training;DME Instruction;Ultrasound;Therapeutic exercise;Balance training;Neuromuscular re-education;Patient/family education;Dry needling;Passive range of motion;Manual techniques;Taping    Consulted and Agree with Plan of Care  Patient       Patient will benefit from skilled therapeutic intervention in order to improve the following deficits and impairments:  Abnormal gait, Decreased endurance, Hypomobility, Decreased scar mobility, Decreased activity tolerance, Decreased strength, Pain, Increased muscle spasms, Difficulty  walking, Decreased mobility, Decreased balance, Decreased range of motion, Postural dysfunction, Impaired flexibility  Visit Diagnosis: Acute pain of left knee  Stiffness of knee joint, left  Other abnormalities of gait and mobility  Muscle weakness     Problem List There are no active problems to display for this patient.   [name], PT, DPT 10:12 AM   10/03/17    [facility] [place], [place], [address] Phone: [phone]   Fax:  [phone]  Name: Randall Hooper MRN: [id number] Date of Birth: 02/23/1966

## 2017-10-07 ENCOUNTER — Encounter: Payer: Self-pay | Admitting: Physical Therapy

## 2017-10-07 ENCOUNTER — Ambulatory Visit (INDEPENDENT_AMBULATORY_CARE_PROVIDER_SITE_OTHER): Payer: BC Managed Care – PPO | Admitting: Physical Therapy

## 2017-10-07 DIAGNOSIS — M25562 Pain in left knee: Secondary | ICD-10-CM | POA: Diagnosis not present

## 2017-10-07 DIAGNOSIS — R2689 Other abnormalities of gait and mobility: Secondary | ICD-10-CM

## 2017-10-07 DIAGNOSIS — M25662 Stiffness of left knee, not elsewhere classified: Secondary | ICD-10-CM | POA: Diagnosis not present

## 2017-10-07 DIAGNOSIS — M6281 Muscle weakness (generalized): Secondary | ICD-10-CM | POA: Diagnosis not present

## 2017-10-07 NOTE — Therapy (Signed)
[facility] [address] [place], [place], [address] Phone: [phone]   Fax:  [phone]  Physical Therapy Treatment  Patient Details  Name: Randall Hooper MRN: [id number] Date of Birth: 01-11-1966 Referring Provider: Geraldo Docker   Encounter Date: 10/07/2017  PT End of Session - 10/07/17 1029    Visit Number  7    Number of Visits  14    Date for PT Re-Evaluation  11/05/17    Authorization Type  [place]    Authorization - Number of Visits  15    PT Start Time  1015    PT Stop Time  1100    PT Time Calculation (min)  45 min    Activity Tolerance  Patient tolerated treatment well;Patient limited by pain    Behavior During Therapy  [facility] for tasks assessed/performed       Past Medical History:  Diagnosis Date  . Environmental allergies   . GERD (gastroesophageal reflux disease)     History reviewed. No pertinent surgical history.  There were no vitals filed for this visit.  Subjective Assessment - 10/07/17 1028    Subjective  Pt states mild soreness at anterior knee, he has follow up appt at [facility] tomorrow    Currently in Pain?  Yes    Pain Score  4     Pain Location  Knee    Pain Orientation  Left    Pain Descriptors / Indicators  Aching    Pain Type  Acute pain    Pain Onset  More than a month ago    Pain Frequency  Intermittent    Aggravating Factors   riding in car, first thing in AM, getting up from prolonged sitting, prolonged standing.                        [facility] Adult PT Treatment/Exercise - 10/07/17 1026      Exercises   Exercises  Knee/Hip      Knee/Hip Exercises: Stretches   Active Hamstring Stretch  --    Active Hamstring Stretch Limitations  --      Knee/Hip Exercises: Aerobic   Stationary Bike  L1 x 8 min;       Knee/Hip Exercises: Standing   Heel Raises  --    Knee Flexion  20 reps    Hip Flexion  20 reps;Knee bent    Forward Step Up  10 reps;Left;Step Height: 6"    Gait Training   35 ft x8 with practice for heel to toe gait;     Other Standing Knee Exercises  --      Knee/Hip Exercises: Seated   Long Arc Quad  2 sets;10 reps    Long Arc Quad Weight  2 lbs.    Hamstring Curl  --    Hamstring Limitations  --      Knee/Hip Exercises: Supine   Heel Slides  10 reps    Straight Leg Raises  3 sets;10 reps      Modalities   Modalities  Iontophoresis      Iontophoresis   Type of Iontophoresis  Dexamethasone    Location  L anterior knee/ patella tendon    Time  6 hour patch      Manual Therapy   Manual Therapy  Passive ROM;Soft tissue mobilization;Joint mobilization    Joint Mobilization  --    Soft tissue mobilization  DTM/ IASTM to L knee, anterior and lateral  knee, and distal quad    Passive ROM  PROM For L knee flex and ext in supine;                PT Short Term Goals - 10/03/17 8016      PT SHORT TERM GOAL #1   Title  Pt to report decreased pain to 3/10 with activity     Time  2    Period  Weeks    Status  Partially Met      PT SHORT TERM GOAL #2   Title  Pt to demo increased L knee flexion ROM by 10 degrees     Time  2    Period  Weeks    Status  Achieved        PT Long Term Goals - 10/03/17 5537      PT LONG TERM GOAL #1   Title  Pt to demo increased L knee AROM to be WNL for flexion and extension, to improve ability for gait and stairs.     Time  7    Period  Weeks    Status  On-going      PT LONG TERM GOAL #2   Title  Pt to demo increased strength of L knee and hip to at least 4+/5 to improve stability and ability for gait.     Time  7    Period  Weeks    Status  On-going      PT LONG TERM GOAL #3   Title  Pt to report decreased pain to 0-2/10 with standing activity and ambulation, to improve ability for return to work duties.     Time  7    Period  Weeks    Status  On-going      PT LONG TERM GOAL #4   Title  Pt to demo ability for navigation of 5 steps with handrail, to improve ability for community navigation and  work duties.     Time  7    Period  Weeks    Status  On-going            Plan - 10/07/17 1547    Clinical Impression Statement  Pt able to progress ther ex, with improving ability for weight bearing on L, and improving strength of quad. Encouraged increased frequency of HEP. Pt with soreness at lateral knee with manual today. IASTM done for lateral and anterior knee, and Ionto continued as well. Pt to benefit from progression of strengthening.     Rehab Potential  Good    PT Frequency  2x / week    PT Duration  8 weeks    PT Treatment/Interventions  ADLs/Self Care Home Management;Cryotherapy;Electrical Stimulation;Iontophoresis '4mg'$ /ml Dexamethasone;Moist Heat;Therapeutic activities;Functional mobility training;Stair training;Gait training;DME Instruction;Ultrasound;Therapeutic exercise;Balance training;Neuromuscular re-education;Patient/family education;Dry needling;Passive range of motion;Manual techniques;Taping    Consulted and Agree with Plan of Care  Patient       Patient will benefit from skilled therapeutic intervention in order to improve the following deficits and impairments:  Abnormal gait, Decreased endurance, Hypomobility, Decreased scar mobility, Decreased activity tolerance, Decreased strength, Pain, Increased muscle spasms, Difficulty walking, Decreased mobility, Decreased balance, Decreased range of motion, Postural dysfunction, Impaired flexibility  Visit Diagnosis: Acute pain of left knee  Stiffness of knee joint, left  Other abnormalities of gait and mobility  Muscle weakness     Problem List There are no active problems to display for this patient.   [name], PT, DPT 3:49 PM  10/07/17    [facility] [address] [place], [place], [address] Phone: [phone]   Fax:  [phone]  Name: HARITH MCCADDEN MRN: [id number] Date of Birth: 24-Feb-1966

## 2017-10-10 ENCOUNTER — Encounter: Payer: Self-pay | Admitting: Physical Therapy

## 2017-10-10 ENCOUNTER — Ambulatory Visit (INDEPENDENT_AMBULATORY_CARE_PROVIDER_SITE_OTHER): Payer: BC Managed Care – PPO | Admitting: Physical Therapy

## 2017-10-10 DIAGNOSIS — R2689 Other abnormalities of gait and mobility: Secondary | ICD-10-CM

## 2017-10-10 DIAGNOSIS — M25562 Pain in left knee: Secondary | ICD-10-CM

## 2017-10-10 DIAGNOSIS — M6281 Muscle weakness (generalized): Secondary | ICD-10-CM

## 2017-10-10 DIAGNOSIS — M25662 Stiffness of left knee, not elsewhere classified: Secondary | ICD-10-CM

## 2017-10-10 NOTE — Therapy (Signed)
Randall Hooper [address] [place], [place], [address] Phone: [phone]   Fax:  [phone]  Physical Therapy Treatment  Patient Details  Name: Randall Hooper MRN: [id number] Date of Birth: 1965/09/07 Referring Provider: Geraldo Hooper   Encounter Date: 10/10/2017  PT End of Session - 10/10/17 0940    Visit Number  8    Number of Visits  14    Date for PT Re-Evaluation  11/05/17    Authorization Type  [place]    Authorization - Number of Visits  15    PT Start Time  0920    PT Stop Time  1008    PT Time Calculation (min)  48 min    Activity Tolerance  Patient tolerated treatment well;Patient limited by pain    Behavior During Therapy  [facility] for tasks assessed/performed       Past Medical History:  Diagnosis Date  . Environmental allergies   . GERD (gastroesophageal reflux disease)     History reviewed. No pertinent surgical history.  There were no vitals filed for this visit.  Subjective Assessment - 10/10/17 0940    Subjective  Pt had follow up with MD, good report. Plans to return to work Sept 6th.     Currently in Pain?  Yes    Pain Score  2     Pain Location  Knee    Pain Orientation  Left    Pain Descriptors / Indicators  Aching    Pain Type  Acute pain    Pain Onset  More than a month ago    Pain Frequency  Intermittent                       [facility] Adult PT Treatment/Exercise - 10/10/17 0941      Exercises   Exercises  Knee/Hip      Knee/Hip Exercises: Aerobic   Stationary Bike  L2 x10 min    Tread Mill  1.8 mph x 4 min      Knee/Hip Exercises: Standing   Knee Flexion  20 reps    Hip Flexion  20 reps;Knee bent    Forward Step Up  --    Gait Training  --    Other Standing Knee Exercises  BWD walking 25 ft x4; Walk/March 10 ft x4;     Other Standing Knee Exercises  Tamndem stance 30 sec x3;       Knee/Hip Exercises: Seated   Long Arc Quad  10 reps;3 sets    Long Arc Quad Weight  2 lbs.    Hamstring Curl  20 reps    Hamstring Limitations  GTB      Knee/Hip Exercises: Supine   Heel Slides  --    Straight Leg Raises  3 sets;10 reps    Other Supine Knee/Hip Exercises  SKTC for knee flexion 30 sec x3;       Knee/Hip Exercises: Sidelying   Hip ABduction  20 reps;Left      Modalities   Modalities  Iontophoresis      Iontophoresis   Type of Iontophoresis  Dexamethasone    Location  L anterior knee/ patella tendon    Time  6 hour patch      Manual Therapy   Manual Therapy  Passive ROM;Soft tissue mobilization;Joint mobilization    Soft tissue mobilization  --    Passive ROM  PROM For L knee flex and ext in supine;  PT Short Term Goals - 10/03/17 1025      PT SHORT TERM GOAL #1   Title  Pt to report decreased pain to 3/10 with activity     Time  2    Period  Weeks    Status  Partially Met      PT SHORT TERM GOAL #2   Title  Pt to demo increased L knee flexion ROM by 10 degrees     Time  2    Period  Weeks    Status  Achieved        PT Long Term Goals - 10/03/17 8527      PT LONG TERM GOAL #1   Title  Pt to demo increased L knee AROM to be WNL for flexion and extension, to improve ability for gait and stairs.     Time  7    Period  Weeks    Status  On-going      PT LONG TERM GOAL #2   Title  Pt to demo increased strength of L knee and hip to at least 4+/5 to improve stability and ability for gait.     Time  7    Period  Weeks    Status  On-going      PT LONG TERM GOAL #3   Title  Pt to report decreased pain to 0-2/10 with standing activity and ambulation, to improve ability for return to work duties.     Time  7    Period  Weeks    Status  On-going      PT LONG TERM GOAL #4   Title  Pt to demo ability for navigation of 5 steps with handrail, to improve ability for community navigation and work duties.     Time  7    Period  Weeks    Status  On-going            Plan - 10/10/17 1206    Clinical Impression Statement   Pt showing good progress. Discussed pt starting to go to gym to use Bicycle. He has improving ROM and strength in L knee, plan to progress gait, stability and strength for work duties.     Rehab Potential  Good    PT Frequency  2x / week    PT Duration  8 weeks    PT Treatment/Interventions  ADLs/Self Care Home Management;Cryotherapy;Electrical Stimulation;Iontophoresis '4mg'$ /ml Dexamethasone;Moist Heat;Therapeutic activities;Functional mobility training;Stair training;Gait training;DME Instruction;Ultrasound;Therapeutic exercise;Balance training;Neuromuscular re-education;Patient/family education;Dry needling;Passive range of motion;Manual techniques;Taping    Consulted and Agree with Plan of Care  Patient       Patient will benefit from skilled therapeutic intervention in order to improve the following deficits and impairments:  Abnormal gait, Decreased endurance, Hypomobility, Decreased scar mobility, Decreased activity tolerance, Decreased strength, Pain, Increased muscle spasms, Difficulty walking, Decreased mobility, Decreased balance, Decreased range of motion, Postural dysfunction, Impaired flexibility  Visit Diagnosis: Acute pain of left knee  Stiffness of knee joint, left  Other abnormalities of gait and mobility  Muscle weakness     Problem List There are no active problems to display for this patient.   Randall Hooper, PT, DPT 12:07 PM  10/10/17    [place], [place], [address] Phone: [phone]   Fax:  [phone]  Name: Randall Hooper MRN: [id number] Date of Birth: 1966/02/13

## 2017-10-14 ENCOUNTER — Encounter: Payer: Self-pay | Admitting: Physical Therapy

## 2017-10-14 ENCOUNTER — Ambulatory Visit (INDEPENDENT_AMBULATORY_CARE_PROVIDER_SITE_OTHER): Payer: BC Managed Care – PPO | Admitting: Physical Therapy

## 2017-10-14 DIAGNOSIS — M6281 Muscle weakness (generalized): Secondary | ICD-10-CM | POA: Diagnosis not present

## 2017-10-14 DIAGNOSIS — M25662 Stiffness of left knee, not elsewhere classified: Secondary | ICD-10-CM

## 2017-10-14 DIAGNOSIS — R2689 Other abnormalities of gait and mobility: Secondary | ICD-10-CM

## 2017-10-14 DIAGNOSIS — M25562 Pain in left knee: Secondary | ICD-10-CM | POA: Diagnosis not present

## 2017-10-14 NOTE — Therapy (Signed)
[facility] [address] [place], [place], [address] Phone: [phone]   Fax:  [phone]  Physical Therapy Treatment  Patient Details  Name: Randall Hooper MRN: [id number] Date of Birth: 09/09/1965 Referring Provider: Geraldo Docker   Encounter Date: 10/14/2017  PT End of Session - 10/14/17 1022    Visit Number  9    Number of Visits  14    Date for PT Re-Evaluation  11/05/17    Authorization Type  [facility]    Authorization - Number of Visits  15    PT Start Time  1016    PT Stop Time  1100    PT Time Calculation (min)  44 min    Activity Tolerance  Patient tolerated treatment well;Patient limited by pain    Behavior During Therapy  [facility] for tasks assessed/performed       Past Medical History:  Diagnosis Date  . Environmental allergies   . GERD (gastroesophageal reflux disease)     History reviewed. No pertinent surgical history.  There were no vitals filed for this visit.  Subjective Assessment - 10/14/17 1020    Subjective  Pt states increased soreness on sundays due to increased standing, walking at church.     Currently in Pain?  Yes    Pain Score  3     Pain Location  Knee    Pain Orientation  Left    Pain Descriptors / Indicators  Aching    Pain Type  Acute pain    Pain Onset  More than a month ago    Pain Frequency  Intermittent                       [facility] Adult PT Treatment/Exercise - 10/14/17 1025      Exercises   Exercises  Knee/Hip      Knee/Hip Exercises: Stretches   Active Hamstring Stretch  3 reps;30 seconds;Left      Knee/Hip Exercises: Aerobic   Stationary Bike  L2 x10 min    Tread Mill  --      Knee/Hip Exercises: Standing   Knee Flexion  --    Hip Flexion  --    Forward Step Up  10 reps;Left;Step Height: 6"    Functional Squat  20 reps    Stairs  5 steps x 5 1 hand rail, reciprocol gait;     Other Standing Knee Exercises  BWD walking 25 ft x4; Walk/March 10 ft x4;     Other  Standing Knee Exercises  Tamndem stance 30 sec x3; A/P weight shifts x20;  Transfers to/from kneeling on R x5 with 1 UE support;        Knee/Hip Exercises: Seated   Long Arc Quad  10 reps;3 sets    [employer] Weight  2 lbs.    Hamstring Curl  20 reps    Hamstring Limitations  GTB      Knee/Hip Exercises: Supine   Straight Leg Raises  --    Other Supine Knee/Hip Exercises  --      Knee/Hip Exercises: Sidelying   Hip ABduction  --      Modalities   Modalities  Iontophoresis      Iontophoresis   Type of Iontophoresis  Dexamethasone    Location  L anterior knee/ patella tendon    Time  6 hour patch      Manual Therapy   Manual Therapy  --  Passive ROM  --               PT Short Term Goals - 10/03/17 2703      PT SHORT TERM GOAL #1   Title  Pt to report decreased pain to 3/10 with activity     Time  2    Period  Weeks    Status  Partially Met      PT SHORT TERM GOAL #2   Title  Pt to demo increased L knee flexion ROM by 10 degrees     Time  2    Period  Weeks    Status  Achieved        PT Long Term Goals - 10/03/17 5009      PT LONG TERM GOAL #1   Title  Pt to demo increased L knee AROM to be WNL for flexion and extension, to improve ability for gait and stairs.     Time  7    Period  Weeks    Status  On-going      PT LONG TERM GOAL #2   Title  Pt to demo increased strength of L knee and hip to at least 4+/5 to improve stability and ability for gait.     Time  7    Period  Weeks    Status  On-going      PT LONG TERM GOAL #3   Title  Pt to report decreased pain to 0-2/10 with standing activity and ambulation, to improve ability for return to work duties.     Time  7    Period  Weeks    Status  On-going      PT LONG TERM GOAL #4   Title  Pt to demo ability for navigation of 5 steps with handrail, to improve ability for community navigation and work duties.     Time  7    Period  Weeks    Status  On-going            Plan - 10/14/17  1207    Clinical Impression Statement  Pt improving with the ex for strengthening. He has improving ability for exercises with decreasing pain at anterior knee. Pt apprehensive for work type duties with full body contact for take downs. Pt practiced transfers to/from kneeling position todayd, requires definite need for UE support to rise back up. Pt to benefit from continued strengthening.     Rehab Potential  Good    PT Frequency  2x / week    PT Duration  8 weeks    PT Treatment/Interventions  ADLs/Self Care Home Management;Cryotherapy;Electrical Stimulation;Iontophoresis '4mg'$ /ml Dexamethasone;Moist Heat;Therapeutic activities;Functional mobility training;Stair training;Gait training;DME Instruction;Ultrasound;Therapeutic exercise;Balance training;Neuromuscular re-education;Patient/family education;Dry needling;Passive range of motion;Manual techniques;Taping    Consulted and Agree with Plan of Care  Patient       Patient will benefit from skilled therapeutic intervention in order to improve the following deficits and impairments:  Abnormal gait, Decreased endurance, Hypomobility, Decreased scar mobility, Decreased activity tolerance, Decreased strength, Pain, Increased muscle spasms, Difficulty walking, Decreased mobility, Decreased balance, Decreased range of motion, Postural dysfunction, Impaired flexibility  Visit Diagnosis: Acute pain of left knee  Stiffness of knee joint, left  Other abnormalities of gait and mobility  Muscle weakness     Problem List There are no active problems to display for this patient.  [name], PT, DPT 12:09 PM  10/14/17    [facility]  [place], [place], [address] Phone: [phone]   Fax:  [phone]  Name: Randall Hooper MRN: [id number] Date of Birth: 04-25-1965

## 2017-10-17 ENCOUNTER — Ambulatory Visit (INDEPENDENT_AMBULATORY_CARE_PROVIDER_SITE_OTHER): Payer: BC Managed Care – PPO | Admitting: Physical Therapy

## 2017-10-17 ENCOUNTER — Encounter: Payer: Self-pay | Admitting: Physical Therapy

## 2017-10-17 DIAGNOSIS — M6281 Muscle weakness (generalized): Secondary | ICD-10-CM

## 2017-10-17 DIAGNOSIS — R2689 Other abnormalities of gait and mobility: Secondary | ICD-10-CM

## 2017-10-17 DIAGNOSIS — M25562 Pain in left knee: Secondary | ICD-10-CM | POA: Diagnosis not present

## 2017-10-17 DIAGNOSIS — M25662 Stiffness of left knee, not elsewhere classified: Secondary | ICD-10-CM

## 2017-10-17 NOTE — Therapy (Signed)
[facility] [address] [place], [place], [address] Phone: [phone]   Fax:  [phone]  Physical Therapy Treatment  Patient Details  Name: Randall Hooper MRN: [id number] Date of Birth: 09-22-1965 Referring Provider: Geraldo Docker   Encounter Date: 10/17/2017  PT End of Session - 10/17/17 1428    Visit Number  10    Number of Visits  14    Date for PT Re-Evaluation  11/05/17    Authorization Type  [facility]    Authorization - Number of Visits  15    PT Start Time  0928    PT Stop Time  1010    PT Time Calculation (min)  42 min    Activity Tolerance  Patient tolerated treatment well;Patient limited by pain    Behavior During Therapy  [facility] for tasks assessed/performed       Past Medical History:  Diagnosis Date  . Environmental allergies   . GERD (gastroesophageal reflux disease)     History reviewed. No pertinent surgical history.  There were no vitals filed for this visit.  Subjective Assessment - 10/17/17 1427    Subjective  Pt with no new complaints.     Currently in Pain?  Yes    Pain Score  1     Pain Location  Knee    Pain Orientation  Left    Pain Descriptors / Indicators  Aching    Pain Type  Acute pain    Pain Onset  More than a month ago    Pain Frequency  Intermittent         [facility] PT Assessment - 10/17/17 0001      AROM   Left Knee Extension  0    Left Knee Flexion  127                   [facility] Adult PT Treatment/Exercise - 10/17/17 1002      Exercises   Exercises  Knee/Hip      Knee/Hip Exercises: Stretches   Active Hamstring Stretch  3 reps;30 seconds;Left      Knee/Hip Exercises: Aerobic   Stationary Bike  L2 x9 min      Knee/Hip Exercises: Standing   Knee Flexion  20 reps    Hip Flexion  20 reps;Knee bent    Forward Step Up  10 reps;Left;Step Height: 6"    Functional Squat  --    Stairs  5 steps x 5 1 hand rail, reciprocol gait;     Other Standing Knee Exercises   Walk/March  10 ft x4;     Other Standing Knee Exercises  --      Knee/Hip Exercises: Seated   Long Arc Quad  10 reps;3 sets    Long Arc Quad Weight  2 lbs.    Hamstring Curl  20 reps    Hamstring Limitations  GTB      Knee/Hip Exercises: Supine   Heel Slides  15 reps    Heel Slides Limitations  strap    Straight Leg Raises  3 sets;10 reps      Modalities   Modalities  Iontophoresis      Iontophoresis   Type of Iontophoresis  --    Location  --    Time  --             PT Education - 10/17/17 1427    Education Details  Recommendations to perform HEP as directed.  Person(s) Educated  Patient    Methods  Explanation    Comprehension  Verbalized understanding       PT Short Term Goals - 10/03/17 0938      PT SHORT TERM GOAL #1   Title  Pt to report decreased pain to 3/10 with activity     Time  2    Period  Weeks    Status  Partially Met      PT SHORT TERM GOAL #2   Title  Pt to demo increased L knee flexion ROM by 10 degrees     Time  2    Period  Weeks    Status  Achieved        PT Long Term Goals - 10/03/17 7341      PT LONG TERM GOAL #1   Title  Pt to demo increased L knee AROM to be WNL for flexion and extension, to improve ability for gait and stairs.     Time  7    Period  Weeks    Status  On-going      PT LONG TERM GOAL #2   Title  Pt to demo increased strength of L knee and hip to at least 4+/5 to improve stability and ability for gait.     Time  7    Period  Weeks    Status  On-going      PT LONG TERM GOAL #3   Title  Pt to report decreased pain to 0-2/10 with standing activity and ambulation, to improve ability for return to work duties.     Time  7    Period  Weeks    Status  On-going      PT LONG TERM GOAL #4   Title  Pt to demo ability for navigation of 5 steps with handrail, to improve ability for community navigation and work duties.     Time  7    Period  Weeks    Status  On-going            Plan - 10/17/17 1428    Clinical  Impression Statement  Pt is showing good improvements. Recommended that pt start walking for exercise, to increase endurance for return to work, as well as to perform HEP for ROM and strength. Pt will decrease to 1x/wk , and work towards d/c.     Rehab Potential  Good    PT Frequency  2x / week    PT Duration  8 weeks    PT Treatment/Interventions  ADLs/Self Care Home Management;Cryotherapy;Electrical Stimulation;Iontophoresis '4mg'$ /ml Dexamethasone;Moist Heat;Therapeutic activities;Functional mobility training;Stair training;Gait training;DME Instruction;Ultrasound;Therapeutic exercise;Balance training;Neuromuscular re-education;Patient/family education;Dry needling;Passive range of motion;Manual techniques;Taping    Consulted and Agree with Plan of Care  Patient       Patient will benefit from skilled therapeutic intervention in order to improve the following deficits and impairments:  Abnormal gait, Decreased endurance, Hypomobility, Decreased scar mobility, Decreased activity tolerance, Decreased strength, Pain, Increased muscle spasms, Difficulty walking, Decreased mobility, Decreased balance, Decreased range of motion, Postural dysfunction, Impaired flexibility  Visit Diagnosis: Acute pain of left knee  Stiffness of knee joint, left  Other abnormalities of gait and mobility  Muscle weakness     Problem List There are no active problems to display for this patient.   [name], PT, DPT 2:30 PM  10/17/17    [facility] [address] [place], [place], [address] Phone: [phone]   Fax:  [phone]  Name: Randall Hooper MRN: [id number] Date of Birth: 1965/10/28

## 2017-10-23 ENCOUNTER — Encounter: Payer: Self-pay | Admitting: Physical Therapy

## 2017-10-23 ENCOUNTER — Ambulatory Visit (INDEPENDENT_AMBULATORY_CARE_PROVIDER_SITE_OTHER): Payer: BC Managed Care – PPO | Admitting: Physical Therapy

## 2017-10-23 DIAGNOSIS — M25662 Stiffness of left knee, not elsewhere classified: Secondary | ICD-10-CM

## 2017-10-23 DIAGNOSIS — M25562 Pain in left knee: Secondary | ICD-10-CM | POA: Diagnosis not present

## 2017-10-23 DIAGNOSIS — M6281 Muscle weakness (generalized): Secondary | ICD-10-CM | POA: Diagnosis not present

## 2017-10-23 DIAGNOSIS — R2689 Other abnormalities of gait and mobility: Secondary | ICD-10-CM

## 2017-10-23 NOTE — Therapy (Signed)
[facility] [address] [place], [place], [address] Phone: [phone]   Fax:  [phone]  Physical Therapy Treatment/Discharge  Patient Details  Name: Randall Hooper MRN: [id number] Date of Birth: 1965/06/03 Referring Provider: Geraldo Docker   Encounter Date: 10/23/2017  PT End of Session - 10/23/17 1015    Visit Number  11    Number of Visits  14    Date for PT Re-Evaluation  11/05/17    Authorization Type  [facility]    Authorization - Number of Visits  15    PT Start Time  0932    PT Stop Time  1013    PT Time Calculation (min)  41 min    Activity Tolerance  Patient tolerated treatment well;Patient limited by pain    Behavior During Therapy  [facility] for tasks assessed/performed       Past Medical History:  Diagnosis Date  . Environmental allergies   . GERD (gastroesophageal reflux disease)     History reviewed. No pertinent surgical history.  There were no vitals filed for this visit.  Subjective Assessment - 10/23/17 0936    Subjective  Pt with no new complaints. He has minimal pain in knee. States that he is returning to work (light duty) on Sept 6th, and also following up with MD on that day.     Currently in Pain?  No/denies         [facility] PT Assessment - 10/23/17 0001      AROM   Left Knee Extension  0    Left Knee Flexion  128      Strength   Left Knee Flexion  5/5    Left Knee Extension  4+/5                   [facility] Adult PT Treatment/Exercise - 10/23/17 0001      Knee/Hip Exercises: Aerobic   Stationary Bike  L2 x 9 min      Knee/Hip Exercises: Standing   Heel Raises  20 reps    Hip Flexion  20 reps;Knee bent    Hip Abduction  20 reps;Both    Lateral Step Up  10 reps;Left;Step Height: 6"    Forward Step Up  10 reps;Left;Step Height: 6"    Functional Squat  20 reps    Other Standing Knee Exercises   Walk/March 10 ft x4;       Knee/Hip Exercises: Seated   Long Arc Quad  10 reps;3 sets     Long Arc Quad Weight  -- 2.5 lb    Hamstring Curl  20 reps    Hamstring Limitations  GTB      Knee/Hip Exercises: Supine   Straight Leg Raises  3 sets;10 reps    Other Supine Knee/Hip Exercises  SKTC for knee flexion 30 sec x3;       Manual Therapy   Passive ROM  PROM For L knee flex and ext in supine;              PT Education - 10/23/17 0937    Education Details  Final HEP reviewed.     Person(s) Educated  Patient    Methods  Explanation    Comprehension  Verbalized understanding       PT Short Term Goals - 10/23/17 1134      PT SHORT TERM GOAL #1   Title  Pt to report decreased pain to 3/10 with activity  Time  2    Period  Weeks    Status  Achieved      PT SHORT TERM GOAL #2   Title  Pt to demo increased L knee flexion ROM by 10 degrees     Time  2    Period  Weeks    Status  Achieved        PT Long Term Goals - 10/23/17 1134      PT LONG TERM GOAL #1   Title  Pt to demo increased L knee AROM to be WNL for flexion and extension, to improve ability for gait and stairs.     Time  7    Period  Weeks    Status  Achieved      PT LONG TERM GOAL #2   Title  Pt to demo increased strength of L knee and hip to at least 4+/5 to improve stability and ability for gait.     Time  7    Period  Weeks    Status  Achieved      PT LONG TERM GOAL #3   Title  Pt to report decreased pain to 0-2/10 with standing activity and ambulation, to improve ability for return to work duties.     Time  7    Period  Weeks    Status  Achieved      PT LONG TERM GOAL #4   Title  Pt to demo ability for navigation of 5 steps with handrail, to improve ability for community navigation and work duties.     Time  7    Period  Weeks    Status  Achieved            Plan - 10/23/17 1135    Clinical Impression Statement  Pt has made good improvements. He has met all goals for PT, and has ROM and strength WNL. He has much improved ability for gait and stairs. He is ready for d/c to  HEP. Final HEP reviewed today. Pt in agreement with d/c plan.     Rehab Potential  Good    PT Frequency  2x / week    PT Duration  8 weeks    PT Treatment/Interventions  ADLs/Self Care Home Management;Cryotherapy;Electrical Stimulation;Iontophoresis '4mg'$ /ml Dexamethasone;Moist Heat;Therapeutic activities;Functional mobility training;Stair training;Gait training;DME Instruction;Ultrasound;Therapeutic exercise;Balance training;Neuromuscular re-education;Patient/family education;Dry needling;Passive range of motion;Manual techniques;Taping    Consulted and Agree with Plan of Care  Patient       Patient will benefit from skilled therapeutic intervention in order to improve the following deficits and impairments:  Abnormal gait, Decreased endurance, Hypomobility, Decreased scar mobility, Decreased activity tolerance, Decreased strength, Pain, Increased muscle spasms, Difficulty walking, Decreased mobility, Decreased balance, Decreased range of motion, Postural dysfunction, Impaired flexibility  Visit Diagnosis: Acute pain of left knee  Stiffness of knee joint, left  Other abnormalities of gait and mobility  Muscle weakness     Problem List There are no active problems to display for this patient.    [name], PT, DPT 11:42 AM  10/23/17   [facility], [place], [place], [address] Phone: [phone]   Fax:  [phone]  Name: CHAYDEN GARRELTS MRN: [id number] Date of Birth: 07-21-1965      PHYSICAL THERAPY DISCHARGE SUMMARY  Visits from Start of Care:   11  Plan: Patient agrees to discharge.  Patient goals were met. Patient is being discharged due to meeting the stated rehab goals.  ?????  [name], PT, DPT 11:42 AM  10/23/17

## 2018-01-15 ENCOUNTER — Encounter: Payer: Self-pay | Admitting: Internal Medicine

## 2018-02-11 ENCOUNTER — Encounter: Payer: Self-pay | Admitting: Internal Medicine

## 2018-02-11 ENCOUNTER — Ambulatory Visit (INDEPENDENT_AMBULATORY_CARE_PROVIDER_SITE_OTHER): Payer: No Typology Code available for payment source | Admitting: Internal Medicine

## 2018-02-11 ENCOUNTER — Encounter (INDEPENDENT_AMBULATORY_CARE_PROVIDER_SITE_OTHER): Payer: Self-pay

## 2018-02-11 VITALS — BP 114/70 | HR 68 | Ht 74.0 in | Wt 282.1 lb

## 2018-02-11 DIAGNOSIS — K5901 Slow transit constipation: Secondary | ICD-10-CM

## 2018-02-11 DIAGNOSIS — K219 Gastro-esophageal reflux disease without esophagitis: Secondary | ICD-10-CM | POA: Diagnosis not present

## 2018-02-11 DIAGNOSIS — A048 Other specified bacterial intestinal infections: Secondary | ICD-10-CM

## 2018-02-11 DIAGNOSIS — R1013 Epigastric pain: Secondary | ICD-10-CM | POA: Diagnosis not present

## 2018-02-11 DIAGNOSIS — Z1211 Encounter for screening for malignant neoplasm of colon: Secondary | ICD-10-CM | POA: Diagnosis not present

## 2018-02-11 MED ORDER — NA SULFATE-K SULFATE-MG SULF 17.5-3.13-1.6 GM/177ML PO SOLN: 1.0000 | Freq: Once | ORAL | 0 refills | Status: AC

## 2018-02-11 NOTE — Patient Instructions (Signed)

## 2018-02-11 NOTE — Progress Notes (Signed)
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  I have reviewed outside records.  Patient is said to have had colonoscopy December 2018 with poor preparation.  Review of pathology shows a hyperplastic rectal polyp.  No further details.  Next, he has been having intermittent problems with classic reflux and epigastric discomfort.  He was tested for Helicobacter pylori and subsequently treated with quadruple therapy including amoxicillin, Flagyl, bismuth, and pantoprazole for 14 days.  Patient tells me that his epigastric symptoms have improved since that time.  He does take PPI on demand (omeprazole 20 mg) for breakthrough reflux symptoms and ranitidine on demand for "gas".  He denies dysphasia.  He does take occasional NSAIDs in the form of Naprosyn.  Also baby aspirin.  GI review of systems is also remarkable for bloating and gas as well as issues with constipation associated with dietary habits.  Patient tells me that he had no difficulties with his preparation last time and felt that he was well-prepped.  Review of outside blood work from August 2019 shows normal hemoglobin of 14.6.Marland Kitchen  Previous CT scan of the abdomen with contrast January 2011 was negative for acute findings.  Patient denies a family history of colon cancer.  GI review of systems is otherwise negative.  REVIEW OF SYSTEMS:  All non-GI ROS negative unless otherwise stated in the HPI except for anxiety, arthritis  Past Medical History:  Diagnosis Date  . Anxiety   . Diverticulosis   . Environmental allergies   . GERD (gastroesophageal reflux disease)   . H. pylori infection     Past Surgical History:  Procedure Laterality Date  . KNEE ARTHROSCOPY W/ MENISCAL REPAIR Left     Social History Randall Hooper  reports that he quit smoking about 19  years ago. He has never used smokeless tobacco. He reports that he does not drink alcohol or use drugs.  family history includes Prostate cancer in his father.  No Known Allergies     PHYSICAL EXAMINATION: Vital signs: BP 114/70 (BP Location: Left Arm, Patient Position: Sitting, Cuff Size: Normal)   Pulse 68   Ht 6\' 2"  (1.88 m) Comment: pt stated height  Wt 282 lb 2 oz (128 kg)   BMI 36.22 kg/m   Constitutional: generally well-appearing, no acute distress Psychiatric: alert and oriented x3, cooperative Eyes: extraocular movements intact, anicteric, conjunctiva pink Mouth: oral pharynx moist, no lesions Neck: supple no lymphadenopathy Cardiovascular: heart regular rate and rhythm, no murmur Lungs: clear to auscultation bilaterally Abdomen: soft, nontender, nondistended, no obvious ascites, no peritoneal signs, normal bowel sounds, no organomegaly Rectal: Deferred until colonoscopy Extremities: no clubbing, cyanosis, or lower extremity edema bilaterally Skin: no lesions on visible extremities Neuro: No focal deficits.  Cranial nerves intact  ASSESSMENT:   1.  GERD.  Requires on-demand PPI to control symptoms 2.  History of Helicobacter pylori status post treatment with quadruple therapy August 2019 3.  Epigastric pain.  Likely related to GERD and/or ulcer disease 4.  Colonoscopy last year with poor preparation.  Follow-up in 1 year recommended.  Hyperplastic polyp noted 5.  Constipation.  Seems to be dependent on diet.  Recommend high-fiber and plenty of water  PLAN:  1.  Reflux precautions 2.  Continue PPI on demand 3.  Schedule upper endoscopy to evaluate chronic  GERD, epigastric pain and assess for Helicobacter pylori eradication.The nature of the procedure, as well as the risks, benefits, and alternatives were carefully and thoroughly reviewed with the patient. Ample time for discussion and questions allowed. The patient understood, was satisfied, and agreed to proceed. 4.   Screening colonoscopy.  Patient will require additional preparation to include magnesium citrate followed by standard split prep.The nature of the procedure, as well as the risks, benefits, and alternatives were carefully and thoroughly reviewed with the patient. Ample time for discussion and questions allowed. The patient understood, was satisfied, and agreed to proceed. 5.  [facility]  A copy of this consultation note has been sent to the [facility]

## 2018-03-07 ENCOUNTER — Encounter: Payer: Self-pay | Admitting: Internal Medicine

## 2018-03-24 ENCOUNTER — Encounter: Payer: No Typology Code available for payment source | Admitting: Internal Medicine

## 2018-04-02 ENCOUNTER — Encounter: Payer: No Typology Code available for payment source | Admitting: Internal Medicine

## 2018-04-10 ENCOUNTER — Encounter: Payer: No Typology Code available for payment source | Admitting: Internal Medicine

## 2019-01-08 ENCOUNTER — Ambulatory Visit (INDEPENDENT_AMBULATORY_CARE_PROVIDER_SITE_OTHER): Payer: No Typology Code available for payment source | Admitting: Internal Medicine

## 2019-01-08 ENCOUNTER — Encounter: Payer: Self-pay | Admitting: Internal Medicine

## 2019-01-08 VITALS — BP 100/68 | HR 68 | Temp 97.2°F | Ht 74.0 in | Wt 266.0 lb

## 2019-01-08 DIAGNOSIS — A048 Other specified bacterial intestinal infections: Secondary | ICD-10-CM

## 2019-01-08 DIAGNOSIS — K5900 Constipation, unspecified: Secondary | ICD-10-CM

## 2019-01-08 DIAGNOSIS — Z1159 Encounter for screening for other viral diseases: Secondary | ICD-10-CM

## 2019-01-08 DIAGNOSIS — R1013 Epigastric pain: Secondary | ICD-10-CM | POA: Diagnosis not present

## 2019-01-08 DIAGNOSIS — Z1211 Encounter for screening for malignant neoplasm of colon: Secondary | ICD-10-CM

## 2019-01-08 DIAGNOSIS — K219 Gastro-esophageal reflux disease without esophagitis: Secondary | ICD-10-CM

## 2019-01-08 NOTE — Patient Instructions (Signed)
You may take over the counter Omeprazole as needed.  You have been scheduled for an endoscopy and colonoscopy. Please follow the written instructions given to you at your visit today. Please pick up your prep supplies at the pharmacy within the next 1-3 days. If you use inhalers (even only as needed), please bring them with you on the day of your procedure.  Due to recent COVID-19 restrictions implemented by [employer] and state authorities and in an effort to keep both patients and staff as safe as possible, [place] requires COVID-19 testing prior to any scheduled endoscopic procedure. The testing center is located at [address] [place],  [address] in the [facility] [employer]  suite.  Your appointment has been scheduled for 9:05am on 02/17/2019.   Please bring your insurance cards to this appointment. You will require your COVID screen 2 business days prior to your endoscopic procedure.  You are not required to quarantine after your screening.  You will only receive a phone call with the results if it is POSITIVE.  If you do not receive a call the day before your procedure you should begin your prep, if ordered, and you should report to the endo center for your procedure at your designated appointment arrival time ( one hour prior to the procedure time). There is no cost to you for the screening on the day of the swab.  [facility] will file with your insurance company for the testing.    You may receive an automated phone call prior to your procedure or have a message in your MyChart that you have an appointment for a BP/15 at the [facility], please disregard this message.  Your testing will be at the [place] [place] location.   If you are leaving [facility] travel [address] on [address], turn left onto [address], turn night onto [address] at the 1st stop light turn right, pass the [employer] on your right and proceed to [place] (white building).

## 2019-01-08 NOTE — Progress Notes (Signed)
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  Outside records have been reviewed previously.  Patient is said to have had colonoscopy December 2018 with poor preparation.  Hyperplastic rectal polyp removed.  No further details.  He was advised to follow-up with improved colonic preparation in 1 year.  Patient also has a history of classic reflux with intermittent pyrosis and associated epigastric discomfort.  He has been tested for Helicobacter pylori and subsequently treated with quadruple therapy including amoxicillin, Flagyl, bismuth, and pantoprazole for 14 days.  He continues to use omeprazole OTC on demand for reflux symptoms.  He does experience intermittent reflux but no dysphagia.  Intermittent epigastric burning as well.  He does take occasional NSAIDs in the form of Naprosyn as well as daily baby aspirin.  He was evaluated last year regarding the same.  He does have a tendency toward constipation.  He was scheduled for upper endoscopy to evaluate chronic GERD, epigastric pain, and assess for Helicobacter pylori eradication.  Also he was set up for follow-up screening colonoscopy with more extensive preparation.  For various reasons he did not follow through with procedure as scheduled but returns today to accomplish this feate.  GI review of systems is otherwise negative.  Review of outside blood work from November 12, 2018 shows normal comprehensive metabolic panel.  REVIEW OF SYSTEMS:  All non-GI ROS negative unless otherwise stated in the HPI except for arthritis  Past Medical History:  Diagnosis Date  . Anxiety   . Diverticulosis   . Environmental allergies   . GERD (gastroesophageal reflux disease)   . H. pylori infection     Past Surgical History:  Procedure Laterality Date  . KNEE ARTHROSCOPY  W/ MENISCAL REPAIR Left     Social History Emeril Stille Placeres  reports that he quit smoking about 20 years ago. He has never used smokeless tobacco. He reports that he does not drink alcohol or use drugs.  family history includes Prostate cancer in his father.  No Known Allergies     PHYSICAL EXAMINATION: Vital signs: BP 100/68   Pulse 68   Temp (!) 97.2 F (36.2 C)   Ht 6\' 2"  (1.88 m)   Wt 266 lb (120.7 kg)   BMI 34.15 kg/m   Constitutional: generally well-appearing, no acute distress Psychiatric: alert and oriented x3, cooperative Eyes: extraocular movements intact, anicteric, conjunctiva pink Mouth: oral pharynx moist, no lesions Neck: supple no lymphadenopathy Cardiovascular: heart regular rate and rhythm, no murmur Lungs: clear to auscultation bilaterally Abdomen: soft, nontender, nondistended, no obvious ascites, no peritoneal signs, normal bowel sounds, no organomegaly Rectal: Deferred until colonoscopy Extremities: no clubbing, cyanosis, or lower extremity edema bilaterally Skin: no lesions on visible extremities Neuro: No focal deficits.  Cranial nerves intact  ASSESSMENT:  1.  GERD.  Frequent symptoms requiring PPI therapy.  Currently using treatment on demand 2.  Epigastric discomfort.  Possibly related to GERD.  Rule out ulcer disease and the patient on NSAIDs with a history of Helicobacter pylori 3.  History of Helicobacter pylori status post quadruple therapy August 2019.  Question eradication 4.  Colonoscopy 2018 with poor preparation 5.  Tendency toward constipation   PLAN:  1.  Reflux precautions 2.  Use omeprazole 20 mg daily more frequently to control symptoms 3.  Schedule upper endoscopy  to evaluate GERD symptoms, epigastric pain, and assess for Helicobacter pylori eradication.The nature of the procedure, as well as the risks, benefits, and alternatives were carefully and thoroughly reviewed with the patient. Ample time for discussion and questions  allowed. The patient understood, was satisfied, and agreed to proceed. 4.  Screening colonoscopy.  Prior history of poor preparation.  We will use more extensive prep with magnesium citrate followed by split prep.The nature of the procedure, as well as the risks, benefits, and alternatives were carefully and thoroughly reviewed with the patient. Ample time for discussion and questions allowed. The patient understood, was satisfied, and agreed to proceed. 5.  [facility]

## 2019-01-08 NOTE — Addendum Note (Signed)
Addended by: Audrea Muscat on: 01/08/2019 10:33 AM   Modules accepted: Orders

## 2019-02-17 ENCOUNTER — Ambulatory Visit (INDEPENDENT_AMBULATORY_CARE_PROVIDER_SITE_OTHER): Payer: BC Managed Care – PPO

## 2019-02-17 ENCOUNTER — Other Ambulatory Visit: Payer: Self-pay | Admitting: Internal Medicine

## 2019-02-17 DIAGNOSIS — Z1159 Encounter for screening for other viral diseases: Secondary | ICD-10-CM

## 2019-02-18 LAB — SARS CORONAVIRUS 2 (TAT 6-24 HRS): SARS Coronavirus 2: NEGATIVE

## 2019-02-19 ENCOUNTER — Ambulatory Visit (AMBULATORY_SURGERY_CENTER): Payer: No Typology Code available for payment source | Admitting: Internal Medicine

## 2019-02-19 ENCOUNTER — Other Ambulatory Visit: Payer: Self-pay

## 2019-02-19 ENCOUNTER — Encounter: Payer: Self-pay | Admitting: Internal Medicine

## 2019-02-19 VITALS — BP 120/79 | HR 69 | Temp 98.4°F | Resp 15 | Ht 74.0 in | Wt 266.0 lb

## 2019-02-19 DIAGNOSIS — K2980 Duodenitis without bleeding: Secondary | ICD-10-CM | POA: Diagnosis not present

## 2019-02-19 DIAGNOSIS — A048 Other specified bacterial intestinal infections: Secondary | ICD-10-CM

## 2019-02-19 DIAGNOSIS — K219 Gastro-esophageal reflux disease without esophagitis: Secondary | ICD-10-CM | POA: Diagnosis not present

## 2019-02-19 DIAGNOSIS — Z1211 Encounter for screening for malignant neoplasm of colon: Secondary | ICD-10-CM

## 2019-02-19 DIAGNOSIS — R1013 Epigastric pain: Secondary | ICD-10-CM | POA: Diagnosis not present

## 2019-02-19 MED ORDER — SODIUM CHLORIDE 0.9 % IV SOLN: 500.0000 mL | Freq: Once | INTRAVENOUS | Status: DC

## 2019-02-19 MED ORDER — OMEPRAZOLE 40 MG PO CPDR: 40.0000 mg | DELAYED_RELEASE_CAPSULE | Freq: Every day | ORAL | 11 refills | Status: AC

## 2019-02-19 NOTE — Op Note (Signed)
[facility] Patient Name: Randall Hooper Procedure Date: 02/19/2019 10:55 AM MRN: [id number] Endoscopist: Wilhemina BonitoJohn N. Marina GoodellPerry , MD Age: [age] Referring MD:  Date of Birth: 1965-10-13 Gender: Male Account #: [id number] Procedure:                Colonoscopy Indications:              Colon cancer screening. Previous examination                            elsewhere December 2018 with poor preparation,                            hyperplastic polyp. A follow-up exam was                            recommended in 1 year. He presents at this time for                            that examination with more extensive preparation. Medicines:                Monitored Anesthesia Care Procedure:                Pre-Anesthesia Assessment:                           - Prior to the procedure, a History and Physical                            was performed, and patient medications and                            allergies were reviewed. The patient's tolerance of                            previous anesthesia was also reviewed. The risks                            and benefits of the procedure and the sedation                            options and risks were discussed with the patient.                            All questions were answered, and informed consent                            was obtained. Prior Anticoagulants: The patient has                            taken no previous anticoagulant or antiplatelet                            agents. ASA Grade Assessment: II - A patient with  mild systemic disease. After reviewing the risks                            and benefits, the patient was deemed in                            satisfactory condition to undergo the procedure.                           After obtaining informed consent, the colonoscope                            was passed under direct vision. Throughout the                            procedure, the patient's blood  pressure, pulse, and                            oxygen saturations were monitored continuously. The                            Colonoscope was introduced through the anus and                            advanced to the the cecum, identified by                            appendiceal orifice and ileocecal valve. The                            ileocecal valve, appendiceal orifice, and rectum                            were photographed. The quality of the bowel                            preparation was excellent. The colonoscopy was                            performed without difficulty. The patient tolerated                            the procedure well. The bowel preparation used was                            magnesium citrate followed by SUPREP via split dose                            instruction. Scope In: 11:06:02 AM Scope Out: 11:20:28 AM Scope Withdrawal Time: 0 hours 10 minutes 49 seconds  Total Procedure Duration: 0 hours 14 minutes 26 seconds  Findings:                 Multiple small and large-mouthed diverticula were  found in the left colon and right colon.                           The exam was otherwise without abnormality on                            direct and retroflexion views. Complications:            No immediate complications. Estimated blood loss:                            None. Estimated Blood Loss:     Estimated blood loss: none. Impression:               - Diverticulosis in the left colon and in the right                            colon.                           - The examination was otherwise normal on direct                            and retroflexion views.                           - No specimens collected. Recommendation:           - Repeat colonoscopy in 10 years for screening                            purposes.                           - Patient has a contact number available for                            emergencies.  The signs and symptoms of potential                            delayed complications were discussed with the                            patient. Return to normal activities tomorrow.                            Written discharge instructions were provided to the                            patient.                           - Resume previous diet.                           - Continue present medications. Docia Chuck. Henrene Pastor, MD 02/19/2019 11:35:00 AM This report has been signed electronically.

## 2019-02-19 NOTE — Op Note (Signed)
[facility] Patient Name: Randall Hooper Procedure Date: 02/19/2019 10:55 AM MRN: [id number] Endoscopist: Wilhemina Bonito. Marina Goodell , MD Age: 53 Referring MD:  Date of Birth: [birth year] Gender: Male Account #: [id number] Procedure:                Upper GI endoscopy with biopsies Indications:              Epigastric abdominal pain, Esophageal reflux.                            History of Helicobacter pylori infection?"treated.                            Not on regular PPI currently. Medicines:                Monitored Anesthesia Care Procedure:                Pre-Anesthesia Assessment:                           - Prior to the procedure, a History and Physical                            was performed, and patient medications and                            allergies were reviewed. The patient's tolerance of                            previous anesthesia was also reviewed. The risks                            and benefits of the procedure and the sedation                            options and risks were discussed with the patient.                            All questions were answered, and informed consent                            was obtained. Prior Anticoagulants: The patient has                            taken no previous anticoagulant or antiplatelet                            agents. ASA Grade Assessment: II - A patient with                            mild systemic disease. After reviewing the risks                            and benefits, the patient was deemed in  satisfactory condition to undergo the procedure.                           After obtaining informed consent, the endoscope was                            passed under direct vision. Throughout the                            procedure, the patient's blood pressure, pulse, and                            oxygen saturations were monitored continuously. The                            Endoscope was  introduced through the mouth, and                            advanced to the second part of duodenum. The upper                            GI endoscopy was accomplished without difficulty.                            The patient tolerated the procedure well. Scope In: Scope Out: Findings:                 The esophagus was normal.                           The stomach is somewhat erythematous mucosa in the                            body and antrum. Biopsies were taken with a cold                            forceps for Helicobacter pylori testing using                            CLOtest.                           The examined duodenum revealed duodenitis without                            ulceration.                           The cardia and gastric fundus were normal on                            retroflexion, save small hiatal hernia. Complications:            No immediate complications. Estimated Blood Loss:     Estimated blood loss: none. Impression:               1.  Gastroduodenitis status post CLO biopsy                           2. Otherwise unremarkable EGD                           3. GERD                           4. Prior history of Helicobacter pylori treated. Recommendation:           1. Omeprazole 40 mg daily; #30; 11 refills                           2. Follow-up CLO biopsy and treat if positive                           3. Reflux precautions                           4. Resume previous diet                           5. Resume previous medications                           6. Resume general medical care with your primary                            [facility] Wilhemina BonitoJohn N. Marina GoodellPerry, MD 02/19/2019 11:39:49 AM This report has been signed electronically.

## 2019-02-19 NOTE — Progress Notes (Signed)
PT taken to PACU. Monitors in place. VSS. Report given to RN. 

## 2019-02-19 NOTE — Progress Notes (Signed)
Called to room to assist during endoscopic procedure.  Patient ID and intended procedure confirmed with present staff. Received instructions for my participation in the procedure from the performing physician.  

## 2019-02-19 NOTE — Patient Instructions (Signed)
YOU HAD AN ENDOSCOPIC PROCEDURE TODAY AT THE [facility]:   Refer to the procedure report that was given to you for any specific questions about what was found during the examination.  If the procedure report does not answer your questions, please call your gastroenterologist to clarify.  If you requested that your care partner not be given the details of your procedure findings, then the procedure report has been included in a sealed envelope for you to review at your convenience later.  YOU SHOULD EXPECT: Some feelings of bloating in the abdomen. Passage of more gas than usual.  Walking can help get rid of the air that was put into your GI tract during the procedure and reduce the bloating. If you had a lower endoscopy (such as a colonoscopy or flexible sigmoidoscopy) you may notice spotting of blood in your stool or on the toilet paper. If you underwent a bowel prep for your procedure, you may not have a normal bowel movement for a few days.  Please Note:  You might notice some irritation and congestion in your nose or some drainage.  This is from the oxygen used during your procedure.  There is no need for concern and it should clear up in a day or so.  SYMPTOMS TO REPORT IMMEDIATELY:   Following lower endoscopy (colonoscopy or flexible sigmoidoscopy):  Excessive amounts of blood in the stool  Significant tenderness or worsening of abdominal pains  Swelling of the abdomen that is new, acute  Fever of 100F or higher   Following upper endoscopy (EGD)  Vomiting of blood or coffee ground material  New chest pain or pain under the shoulder blades  Painful or persistently difficult swallowing  New shortness of breath  Fever of 100F or higher  Black, tarry-looking stools  For urgent or emergent issues, a gastroenterologist can be reached at any hour by calling [phone].   DIET:  We do recommend a small meal at first, but then you may proceed to your regular diet.  Drink  plenty of fluids but you should avoid alcoholic beverages for 24 hours.  ACTIVITY:  You should plan to take it easy for the rest of today and you should NOT DRIVE or use heavy machinery until tomorrow (because of the sedation medicines used during the test).    FOLLOW UP: Our staff will call the number listed on your records 48-72 hours following your procedure to check on you and address any questions or concerns that you may have regarding the information given to you following your procedure. If we do not reach you, we will leave a message.  We will attempt to reach you two times.  During this call, we will ask if you have developed any symptoms of COVID 19. If you develop any symptoms (ie: fever, flu-like symptoms, shortness of breath, cough etc.) before then, please call [phone].  If you test positive for Covid 19 in the 2 weeks post procedure, please call and report this information to us.    If any biopsies were taken you will be contacted by phone or by letter within the next 1-3 weeks.  Please call us at [phone] if you have not heard about the biopsies in 3 weeks.    SIGNATURES/CONFIDENTIALITY: You and/or your care partner have signed paperwork which will be entered into your electronic medical record.  These signatures attest to the fact that that the information above on your After Visit Summary has been reviewed and is   understood.  Full responsibility of the confidentiality of this discharge information lies with you and/or your care-partner. 

## 2019-02-19 NOTE — Progress Notes (Addendum)
History reviewed today  Temp LC VS CW   Pt stated that he felt "a little sweaty" this am and thought his blood sugar was low.  CBG 83.

## 2019-02-20 LAB — HELICOBACTER PYLORI SCREEN-BIOPSY: UREASE: POSITIVE — AB

## 2019-02-23 ENCOUNTER — Telehealth: Payer: Self-pay

## 2019-02-23 NOTE — Telephone Encounter (Signed)
  Follow up Call-  Call back number 02/19/2019  Post procedure Call Back phone  # [phone]  Permission to leave phone message Yes  Some recent data might be hidden     Patient questions:  Do you have a fever, pain , or abdominal swelling? No. Pain Score  0 *  Have you tolerated food without any problems? Yes.    Have you been able to return to your normal activities? Yes.    Do you have any questions about your discharge instructions: Diet   No. Medications  No. Follow up visit  No.  Do you have questions or concerns about your Care? No.  Actions: * If pain score is 4 or above: No action needed, pain <4.  1. Have you developed a fever since your procedure? no  2.   Have you had an respiratory symptoms (SOB or cough) since your procedure? no  3.   Have you tested positive for COVID 19 since your procedure no  4.   Have you had any family members/close contacts diagnosed with the COVID 19 since your procedure?  no   If yes to any of these questions please route to Joylene John, RN and Alphonsa Gin, Therapist, sports.

## 2019-02-23 NOTE — Telephone Encounter (Signed)
Called [phone] and left a messaged we tried to reach pt for a follow up call. maw

## 2019-02-24 ENCOUNTER — Other Ambulatory Visit: Payer: Self-pay

## 2019-02-24 MED ORDER — OMEPRAZOLE 40 MG PO CPDR: 40.0000 mg | DELAYED_RELEASE_CAPSULE | Freq: Two times a day (BID) | ORAL | 0 refills | Status: AC

## 2019-02-24 MED ORDER — CLARITHROMYCIN 500 MG PO TABS: 500.0000 mg | ORAL_TABLET | Freq: Two times a day (BID) | ORAL | 0 refills | Status: AC

## 2019-02-24 MED ORDER — METRONIDAZOLE 250 MG PO TABS: 250.0000 mg | ORAL_TABLET | Freq: Four times a day (QID) | ORAL | 0 refills | Status: AC

## 2019-04-19 ENCOUNTER — Encounter (HOSPITAL_COMMUNITY): Payer: Self-pay | Admitting: Emergency Medicine

## 2019-04-19 ENCOUNTER — Other Ambulatory Visit: Payer: Self-pay

## 2019-04-19 ENCOUNTER — Emergency Department (HOSPITAL_COMMUNITY)
Admission: EM | Admit: 2019-04-19 | Discharge: 2019-04-19 | Disposition: A | Discharge status: Home / Self Care | Payer: No Typology Code available for payment source | Attending: Emergency Medicine | Admitting: Emergency Medicine

## 2019-04-19 DIAGNOSIS — R030 Elevated blood-pressure reading, without diagnosis of hypertension: Secondary | ICD-10-CM | POA: Diagnosis not present

## 2019-04-19 DIAGNOSIS — M79602 Pain in left arm: Secondary | ICD-10-CM | POA: Diagnosis present

## 2019-04-19 DIAGNOSIS — Z5321 Procedure and treatment not carried out due to patient leaving prior to being seen by health care provider: Secondary | ICD-10-CM | POA: Insufficient documentation

## 2019-04-19 NOTE — ED Triage Notes (Signed)
Patient complains of left arm pain that began when he woke from a nap today. Patient states his BP was elevated when checked at home. Patient is not on medication for HTN.

## 2019-05-30 ENCOUNTER — Ambulatory Visit: Payer: BC Managed Care – PPO | Attending: Internal Medicine

## 2019-05-30 DIAGNOSIS — Z23 Encounter for immunization: Secondary | ICD-10-CM

## 2019-05-30 NOTE — Progress Notes (Signed)
   Covid-19 Vaccination Clinic  Name:  Randall Hooper    MRN: [id number] DOB: Sep 22, 1965  05/30/2019  [name] was observed post Covid-19 immunization for 15 minutes without incident. He was provided with Vaccine Information Sheet and instruction to access the V-Safe system.   [name] was instructed to call 911 with any severe reactions post vaccine: Marland Kitchen Difficulty breathing  . Swelling of face and throat  . A fast heartbeat  . A bad rash all over body  . Dizziness and weakness   Immunizations Administered    Name Date Dose VIS Date Route   Moderna COVID-19 Vaccine 05/30/2019  1:03 PM 0.5 mL 02/17/2019 Intramuscular   Manufacturer: Moderna   Lot: [id number]   NDC: 41597-331-25

## 2019-07-01 ENCOUNTER — Ambulatory Visit: Payer: BC Managed Care – PPO | Attending: Internal Medicine

## 2019-07-01 DIAGNOSIS — Z23 Encounter for immunization: Secondary | ICD-10-CM

## 2019-07-01 NOTE — Progress Notes (Signed)
   Covid-19 Vaccination Clinic  Name:  Randall Hooper    MRN: [id number] DOB: January 22, 1966  07/01/2019  Mr. Randall Hooper was observed post Covid-19 immunization for 15 minutes without incident. He was provided with Vaccine Information Sheet and instruction to access the V-Safe system.   Mr. Randall Hooper was instructed to call 911 with any severe reactions post vaccine: Marland Kitchen Difficulty breathing  . Swelling of face and throat  . A fast heartbeat  . A bad rash all over body  . Dizziness and weakness   Immunizations Administered    Name Date Dose VIS Date Route   Moderna COVID-19 Vaccine 07/01/2019 12:04 PM 0.5 mL 02/17/2019 Intramuscular   Manufacturer: Moderna   Lot: [id number]   NDC: 90903-014-99

## 2022-12-10 ENCOUNTER — Ambulatory Visit (HOSPITAL_COMMUNITY)
Admission: EM | Admit: 2022-12-10 | Discharge: 2022-12-10 | Disposition: A | Discharge status: Home / Self Care | Payer: No Typology Code available for payment source | Attending: Internal Medicine | Admitting: Internal Medicine

## 2022-12-10 ENCOUNTER — Encounter (HOSPITAL_COMMUNITY): Payer: Self-pay

## 2022-12-10 DIAGNOSIS — H6123 Impacted cerumen, bilateral: Secondary | ICD-10-CM

## 2022-12-10 NOTE — Discharge Instructions (Signed)
We flushed out the earwax from your ear(s) today.  I am unable to visualize any insects to your left ear. Even though we flushed out your ears today, there still remains wax. You may use hydrogen peroxide/Debrox as needed to help clear this wax. You may also follow-up with your ear nose and throat provider as needed.  If you develop any new or worsening symptoms or if your symptoms do not start to improve, pleases return here or follow-up with your primary care provider. If your symptoms are severe, please go to the emergency room.

## 2022-12-10 NOTE — ED Triage Notes (Signed)
Pt reports a bug flew in his left ear this evening.

## 2022-12-10 NOTE — ED Provider Notes (Signed)
[facility]    CSN: [id number] Arrival date & time: 12/10/22  1850      History   Chief Complaint No chief complaint on file.   HPI Randall Hooper is a 57 y.o. male.   Patient presents to urgent care for evaluation of foreign body sensation to the left ear canal.  He heard and felt a flying insect fly into the left ear canal this afternoon and wonders if it could be stuck.  He notes he likely has some wax to the left ear canal as well.  Hearing is normal.  No other concerns at this time.     Past Medical History:  Diagnosis Date   Anxiety    Diverticulosis    Environmental allergies    GERD (gastroesophageal reflux disease)    H. pylori infection     There are no problems to display for this patient.   Past Surgical History:  Procedure Laterality Date   KNEE ARTHROSCOPY W/ MENISCAL REPAIR Left        Home Medications    Prior to Admission medications   Medication Sig Start Date End Date Taking? Authorizing Provider  aspirin 81 MG chewable tablet Chew 81 mg by mouth daily.    Yes [provider]  omeprazole (PRILOSEC) 40 MG capsule Take 1 capsule (40 mg total) by mouth daily. 02/19/19  Yes Hilarie Fredrickson, MD  omeprazole (PRILOSEC) 40 MG capsule Take 1 capsule (40 mg total) by mouth 2 (two) times daily before a meal. 02/24/19  Yes Hilarie Fredrickson, MD  traZODone (DESYREL) 50 MG tablet Take 50 mg by mouth at bedtime.   Yes [provider]  cholecalciferol (VITAMIN D) 400 units TABS tablet Take 400 Units by mouth.    [provider]  clarithromycin (BIAXIN) 500 MG tablet Take 1 tablet (500 mg total) by mouth 2 (two) times daily. 02/24/19   Hilarie Fredrickson, MD  metroNIDAZOLE (FLAGYL) 250 MG tablet Take 1 tablet (250 mg total) by mouth 4 (four) times daily. 02/24/19   Hilarie Fredrickson, MD  naproxen (NAPROSYN) 250 MG tablet Take 250 mg by mouth as needed.    [provider]    Family History Family History  Problem Relation Age of  Onset   Prostate cancer Father    Esophageal cancer Neg Hx    Pancreatic cancer Neg Hx    Colon cancer Neg Hx    Liver disease Neg Hx    Stomach cancer Neg Hx    Rectal cancer Neg Hx     Social History Social History   Tobacco Use   Smoking status: Former    Current packs/day: 0.00    Types: Cigarettes    Quit date: 2000    Years since quitting: 24.7   Smokeless tobacco: Never  Vaping Use   Vaping status: Never Used  Substance Use Topics   Alcohol use: Never   Drug use: Never     Allergies   Patient has no known allergies.   Review of Systems Review of Systems Per HPI  Physical Exam Triage Vital Signs ED Triage Vitals [12/10/22 1925]  Encounter Vitals Group     BP (!) 136/95     Systolic BP Percentile      Diastolic BP Percentile      Pulse Rate 64     Resp 16     Temp 97.8 F (36.6 C)     Temp Source Oral     SpO2  98 %     Weight      Height      Head Circumference      Peak Flow      Pain Score      Pain Loc      Pain Education      Exclude from Growth Chart    No data found.  Updated Vital Signs BP (!) 136/95 (BP Location: Left Arm)   Pulse 64   Temp 97.8 F (36.6 C) (Oral)   Resp 16   SpO2 98%   Visual Acuity Right Eye Distance:   Left Eye Distance:   Bilateral Distance:    Right Eye Near:   Left Eye Near:    Bilateral Near:     Physical Exam Vitals and nursing note reviewed.  Constitutional:      Appearance: He is not ill-appearing or toxic-appearing.  HENT:     Head: Normocephalic and atraumatic.     Right Ear: Hearing, tympanic membrane, ear canal and external ear normal. There is impacted cerumen.     Left Ear: Hearing, ear canal and external ear normal.     Ears:     Comments: Significant cerumen to the left ear canal, I am still able to see the left tympanic membrane.  I am unable to appreciate any foreign bodies/insects to the left ear canal prior to ear flush.    Nose: Nose normal.     Mouth/Throat:     Lips: Pink.   Eyes:     General: Lids are normal. Vision grossly intact. Gaze aligned appropriately.     Extraocular Movements: Extraocular movements intact.     Conjunctiva/sclera: Conjunctivae normal.  Pulmonary:     Effort: Pulmonary effort is normal.  Musculoskeletal:     Cervical back: Neck supple.  Skin:    General: Skin is warm and dry.     Capillary Refill: Capillary refill takes less than 2 seconds.     Findings: No rash.  Neurological:     General: No focal deficit present.     Mental Status: He is alert and oriented to person, place, and time. Mental status is at baseline.     Cranial Nerves: No dysarthria or facial asymmetry.  Psychiatric:        Mood and Affect: Mood normal.        Speech: Speech normal.        Behavior: Behavior normal.        Thought Content: Thought content normal.        Judgment: Judgment normal.      UC Treatments / Results  Labs (all labs ordered are listed, but only abnormal results are displayed) Labs Reviewed - No data to display  EKG   Radiology No results found.  Procedures Procedures (including critical care time)  Medications Ordered in UC Medications - No data to display  Initial Impression / Assessment and Plan / UC Course  I have reviewed the triage vital signs and the nursing notes.  Pertinent labs & imaging results that were available during my care of the patient were reviewed by me and considered in my medical decision making (see chart for details).   1. Bilateral impacted cerumen I am unable to appreciate any foreign bodies to the left ear canal despite attempts to flush out ears.  Bilateral ears flushed by nursing staff.  I am able to fully visualize left eardrum despite remaining wax to the left ear canal.  Right ear canal remains  full of wax.  No signs of infection to the left ear canal or foreign body. Advised to follow-up with ear nose and throat and/or use Debrox eardrops/hydrogen peroxide at home.  May follow-up with  urgent care as needed.  Counseled patient on potential for adverse effects with medications prescribed/recommended today, strict ER and return-to-clinic precautions discussed, patient verbalized understanding.    Final Clinical Impressions(s) / UC Diagnoses   Final diagnoses:  Bilateral impacted cerumen     Discharge Instructions      We flushed out the earwax from your ear(s) today.  I am unable to visualize any insects to your left ear. Even though we flushed out your ears today, there still remains wax. You may use hydrogen peroxide/Debrox as needed to help clear this wax. You may also follow-up with your ear nose and throat provider as needed.  If you develop any new or worsening symptoms or if your symptoms do not start to improve, pleases return here or follow-up with your primary care provider. If your symptoms are severe, please go to the emergency room.     ED Prescriptions   None    PDMP not reviewed this encounter.   Carlisle Beers, [place] 12/10/22 2017
